# Patient Record
Sex: Female | Born: 1980 | Race: Asian | Hispanic: No | Marital: Single | Smoking: Never smoker
Health system: Southern US, Community
[De-identification: ages and names within clinical notes are randomized; demographics above are authoritative.]

## PROBLEM LIST (undated history)

## (undated) DIAGNOSIS — Z789 Other specified health status: Secondary | ICD-10-CM

## (undated) HISTORY — PX: NO PAST SURGERIES: SHX2092

## (undated) HISTORY — DX: Other specified health status: Z78.9

---

## 2009-12-24 ENCOUNTER — Emergency Department (HOSPITAL_COMMUNITY): Admission: EM | Admit: 2009-12-24 | Discharge: 2009-12-24 | Payer: Self-pay | Admitting: Family Medicine

## 2010-12-15 ENCOUNTER — Inpatient Hospital Stay (INDEPENDENT_AMBULATORY_CARE_PROVIDER_SITE_OTHER)
Admission: RE | Admit: 2010-12-15 | Discharge: 2010-12-15 | Disposition: A | Discharge status: Home / Self Care | Payer: BC Managed Care – PPO | Source: Ambulatory Visit | Attending: Emergency Medicine | Admitting: Emergency Medicine

## 2010-12-15 DIAGNOSIS — N390 Urinary tract infection, site not specified: Secondary | ICD-10-CM

## 2010-12-15 LAB — POCT URINALYSIS DIP (DEVICE)
Glucose, UA: NEGATIVE mg/dL
Ketones, ur: NEGATIVE mg/dL
Nitrite: NEGATIVE
Protein, ur: NEGATIVE mg/dL
Urobilinogen, UA: 0.2 mg/dL (ref 0.0–1.0)
pH: 7 (ref 5.0–8.0)

## 2010-12-15 LAB — POCT PREGNANCY, URINE: Preg Test, Ur: NEGATIVE

## 2010-12-24 LAB — POCT URINALYSIS DIP (DEVICE)
Bilirubin Urine: NEGATIVE
Nitrite: POSITIVE — AB
Protein, ur: 30 mg/dL — AB
pH: 7 (ref 5.0–8.0)

## 2010-12-24 LAB — URINE CULTURE: Colony Count: >=100000

## 2010-12-24 LAB — POCT RAPID STREP A (OFFICE): Streptococcus, Group A Screen (Direct): POSITIVE — AB

## 2010-12-24 LAB — POCT PREGNANCY, URINE: Preg Test, Ur: NEGATIVE

## 2015-01-03 ENCOUNTER — Encounter (HOSPITAL_COMMUNITY): Payer: Self-pay

## 2015-01-03 ENCOUNTER — Emergency Department (HOSPITAL_COMMUNITY)
Admission: EM | Admit: 2015-01-03 | Discharge: 2015-01-03 | Disposition: A | Discharge status: Home / Self Care | Payer: PRIVATE HEALTH INSURANCE | Attending: Emergency Medicine | Admitting: Emergency Medicine

## 2015-01-03 DIAGNOSIS — J069 Acute upper respiratory infection, unspecified: Secondary | ICD-10-CM | POA: Insufficient documentation

## 2015-01-03 DIAGNOSIS — R05 Cough: Secondary | ICD-10-CM | POA: Diagnosis present

## 2015-01-03 MED ORDER — PHENYLEPHRINE-DM-GG-APAP 5-10-200-325 MG PO TABS: 2.0000 | ORAL_TABLET | Freq: Three times a day (TID) | ORAL | Status: DC

## 2015-01-03 MED ORDER — ALBUTEROL SULFATE HFA 108 (90 BASE) MCG/ACT IN AERS
2.0000 | INHALATION_SPRAY | RESPIRATORY_TRACT | Status: AC
  Administered 2015-01-03: 2 via RESPIRATORY_TRACT
  Filled 2015-01-03: qty 6.7

## 2015-01-03 NOTE — ED Notes (Signed)
Pt called in main ED waiting area with no response 

## 2015-01-03 NOTE — Discharge Instructions (Signed)
Please follow directions provided. Use the referral or the resource guide provided to establish care with a primary care doctor to follow up. Use the inhaler 2 puffs every 4 hours to help with coughing. Please take the multisymptom cold medicine 3 times a day to help with your other symptoms. Don't hesitate to return for any new, worsening, or concerning symptoms.   SEEK IMMEDIATE MEDICAL CARE IF:  You have a fever.  You develop severe or persistent headache, ear pain, sinus pain, or chest pain.  You develop wheezing, a prolonged cough, cough up blood, or have a change in your usual mucus (if you have chronic lung disease).  You develop sore muscles or a stiff neck.   Emergency Department Resource Guide 1) Find a Doctor and Pay Out of Pocket Although you won't have to find out who is covered by your insurance plan, it is a good idea to ask around and get recommendations. You will then need to call the office and see if the doctor you have chosen will accept you as a new patient and what types of options they offer for patients who are self-pay. Some doctors offer discounts or will set up payment plans for their patients who do not have insurance, but you will need to ask so you aren't surprised when you get to your appointment.  2) Contact Your Local Health Department Not all health departments have doctors that can see patients for sick visits, but many do, so it is worth a call to see if yours does. If you don't know where your local health department is, you can check in your phone book. The CDC also has a tool to help you locate your state's health department, and many state websites also have listings of all of their local health departments.  3) Find a Walk-in Clinic If your illness is not likely to be very severe or complicated, you may want to try a walk in clinic. These are popping up all over the country in pharmacies, drugstores, and shopping centers. They'Notarianni usually staffed by nurse  practitioners or physician assistants that have been trained to treat common illnesses and complaints. They'[place] usually fairly quick and inexpensive. However, if you have serious medical issues or chronic medical problems, these are probably not your best option.  No Primary Care Doctor: - Call Health Connect at  [phone] - they can help you locate a primary care doctor that  accepts your insurance, provides certain services, etc. - Physician Referral Service- [phone]  Chronic Pain Problems: Organization         Address  Phone   Notes  [facility]  [phone] Patients need to be referred by their primary care doctor.   Medication Assistance: Organization         Address  Phone   Notes  [facility] Medication [facility] [address] [place], [place] [address] [phone] --Must be a resident of [facility] [place] -- Must have NO insurance coverage whatsoever (no Medicaid/ Medicare, etc.) -- The pt. MUST have a primary care doctor that directs their care regularly and follows them in the community   MedAssist  [phone]   [employer]  [phone]    Agencies that provide inexpensive medical care: Organization         Address  Phone   Notes  [facility]  [phone]   [facility]    [phone]  [facility] [address] [place], [place] [address] [phone]   [facility] [place] [address], [place] [phone]) [phone]   Planned Parenthood    [phone]   [facility]    [phone]   [facility]  [address], [place] Phone:  [phone]) [phone], Fax:  [phone]) [phone] Hours of Operation:  9 am - 6 pm, M-F.  Also accepts Medicaid/Medicare and self-pay.  [facility]  [address], [place] Phone: [phone], Fax: [phone]. Hours of Operation:  8:30 am -  5:30 pm, M-F.  Also accepts Medicaid and self-pay.  [facility] [place] [address], [place] Phone: [phone]) [phone]   [facility] [address], [place], [place] [phone], Ext. [phone] Mondays & Thursdays: 7-9 AM.  First 15 patients are seen on a first come, first serve basis.    Medicaid-accepting [facility] Providers:  Organization         Address  Phone   Notes  [facility] [address], [place] [phone] Also accepts self-pay patients.  [facility] [address] [place], [place]  [phone]   [facility] [address], [place] [phone]) [phone]   [facility] [address] [place] [address], [place] [phone]) [phone]   Renaye RakersVeita Bland [address], [place]   [phone] Only accepts [place] Access [place] patients after they have their name applied to their card.   Self-Pay (no insurance) in [facility]:  Organization         Address  Phone   Notes  Sickle Cell Patients, [facility] [address], [place] [phone]) [phone]   [facility] [address], [place] [phone]) [phone]   [facility] [place]  [address], [place] [phone]   [facility]/Dr. Osei-Bonsu  [address] [place] [address], [place] or [phone], [place] [phone]) [phone] Phone number for both [place] and [place] locations is the same.  [facility] [address], [place] [phone]   [facility] [place] [address] [place] [address], [place] or [address] [phone]   [facility] [address], [place] [phone], phone; [phone], fax Sees patients 1st and 3rd Saturday of every month.  Must not qualify for public or private insurance (i.e. Medicaid, Medicare, [place] Health Choice,  Veterans' Benefits)  Household income should be no more than 200% of the poverty level The clinic cannot treat you if you are pregnant or think you are pregnant  Sexually transmitted diseases are not treated at the clinic.    Dental Care: Organization         Address  Phone  Notes  [facility] of [facility] [address], [place] [phone] Accepts children up to age 32 who are enrolled in [place]icaid or [place] Health Choice; pregnant women with a Medicaid card; and children who have applied for Medicaid or [place] Health Choice, but were declined, whose parents can pay a reduced fee at time of service.  [facility] of [facility] [place]  [address], [place] [phone]) [phone] Accepts children up to age 32 who  are enrolled in Medicaid or [place] Health Choice; pregnant women with a Medicaid card; and children who have applied for Medicaid or [place] Health Choice, but were declined, whose parents can pay a reduced fee at time of service.  [facility]  [address], [place] [phone] Patients are seen by appointment only. Walk-ins are not accepted. [facility] will see patients 418 years of age and older. Monday - Tuesday (8am-5pm) Most Wednesdays (8:30-5pm) $30 per visit, cash only  [facility]  [address], [facility] [place] [phone] Patients are seen by appointment only. Walk-ins are not accepted. [facility] will see patients 32 years of age and older. One Wednesday Evening (Monthly: Volunteer Based).  $30 per visit, cash only  [facility]  [phone] for adults; Children under age 604, call Graduate Pediatric Dentistry at [phone]) [phone]. Children aged 534-14, please call [phone] to request a pediatric application.  Dental services are provided in all areas of dental care including fillings, crowns and bridges, complete and  partial dentures, implants, gum treatment, root canals, and extractions. Preventive care is also provided. Treatment is provided to both adults and children. Patients are selected via a lottery and there is often a waiting list.   [facility] [address], [place]  [phone] [email or web address]   [facility] [address], [place], [place] [phone], Ext. [phone] Second and Fourth Thursday of each month, opens at 6:30 AM; Clinic ends at 9 AM.  Patients are seen on a first-come first-served basis, and a limited number are seen during each clinic.   [facility]  [address], [place], [place] [phone]) [phone]   Eligibility Requirements You must have lived in [place], [place], or [place] counties for at least the last three months.   You cannot be eligible for state or federal sponsored National Cityhealthcare insurance, including CIGNAVeterans Administration, [place]icaid, or [employer] EntertainmentMedicare.   You generally cannot be eligible for healthcare insurance through your employer.    How to apply: Eligibility screenings are held every Tuesday and Wednesday afternoon from 1:00 pm until 4:00 pm. You do not need an appointment for the interview!  [facility] [address], [place], [place] [phone]   [facility]  [phone]   [facility]  [phone]   [facility]  [phone]    Behavioral Health Resources in the Community: Intensive Outpatient Programs Organization         Address  Phone  Notes  [facility] [address], [place], [place] [phone]   [facility] [address], [place], [place] [phone]   [facility] [address], [place], [place]  [phone]   [facility] [address],  [place], [place] [phone] or [phone]   Substance Abuse Resources Organization          Address  Phone  Notes  [facility] Drug Services  [phone]   [facility]  [phone]   [facility]  [phone]   [facility]  [phone]   Residential & Outpatient Substance Abuse Program  [phone]   Psychological Services Organization         Address  Phone  Notes  [facility]  [phone]   [facility]  [phone]- [phone]   [facility] [address],  [place] [phone] or [phone]    Mobile Crisis Teams Organization         Address  Phone  Notes  [facility], Mobile Crisis Care Unit  [phone]   [facility]  [address] [place], [place]   [place] Levels [address], [place] [phone]    Self-Help/Support Groups Organization         Address  Phone             Notes  [place]. of [place] - variety of support groups  [place] Call for more information  Narcotics Anonymous (NA), Caring [facility] [address], [employer] [place]  2 meetings at this location   Special educational needs teacher         Address  Phone  Notes  [facility]idential [facility] [place],    [employer]  [phone]   [facility]  [address], [place] [address]2683, [place], [place]   Ricardo [place], [place] [phone] Admissions: 8am-3pm M-F  Incentives Substance [place] [address],    [place], [place] [phone]   [facility] [address] Roy, [place], [place]   [facility] [address],  [place], [place] Choice   Insight Programs - Intensive Outpatient [address], [place], [place]   [facility] ([place].) [address].,  [place], [place] [phone] or [phone]   [facility] (RTS) [address] [place], [place] Accepts Medicaid  [facility] Rodri­guez Hevia [address]  [place] [phone] Substance Abuse/Addiction Treatment   [facility] Organization         Address  Phone  Notes  [facility]  [phone]   Domenic Schwab, PhD [address] Arlis Port[address] [place], [place]   [phone] or [phone]   [place] [address] [place], [place] [phone]   [facility] [address] [place] [address], [place], [place] [phone] Insurance/Medicaid/sponsorship through [facility] and Families [address]                                    [place], [place] [phone] [place] [address], [place] [phone]    Dr. Adele Schilder  [phone]   [facility]. 1) [address] [place] 2) [place] 3)  [place] [address], [place] [phone]  [phone]   [place] [phone] or [phone] (After Hours)

## 2015-01-03 NOTE — ED Provider Notes (Signed)
CSN: [id number]     Arrival date & time 01/03/15  1730 History  This chart was scribed for non-physician practitioner, Harle Battiest, NP-C working with Raeford Razor, MD, by Abel Presto, ED Scribe. This patient was seen in room TR07C/TR07C and the patient's care was started at 6:31 PM.     Chief Complaint  Patient presents with  . Cough  . Sore Throat    Patient is a 34 y.o. female presenting with cough and pharyngitis. The history is provided by the patient and a relative. No language interpreter was used.  Cough Associated symptoms: chills, fever (subjective), rhinorrhea and sore throat   Associated symptoms: no myalgias   Sore Throat   HPI Comments: Daryana Ka Ringwald is a 34 y.o. female who presents to the Emergency Department complaining of dry cough, sore throat, and rhinorrhea with onset 2 days ago. Pt reports associated subjective fever and chills.  Pt not febrile in exam room. Pt denies any significant PMHx. Pt took Tylenol for relief. Pt denies generalized body aches and trouble swallowing. Pt's son is here to translate.   History reviewed. No pertinent past medical history. History reviewed. No pertinent past surgical history. History reviewed. No pertinent family history. History  Substance Use Topics  . Smoking status: Never Smoker   . Smokeless tobacco: Not on file  . Alcohol Use: No   OB History    No data available     Review of Systems  Constitutional: Positive for fever (subjective) and chills.  HENT: Positive for postnasal drip, rhinorrhea and sore throat. Negative for trouble swallowing.   Respiratory: Positive for cough.   Musculoskeletal: Negative for myalgias.      Allergies  Review of patient's allergies indicates no known allergies.  Home Medications   Prior to Admission medications   Not on File   BP 105/70 mmHg  Pulse 72  Temp(Src) 98.2 F (36.8 C) (Oral)  Resp 16  Ht  (1.575 m)  Wt 145 lb (65.772 kg)  BMI 26.51 kg/m2  SpO2 100%   LMP 12/04/2014 Physical Exam  Constitutional: She is oriented to person, place, and time. She appears well-developed and well-nourished.  HENT:  Head: Normocephalic.  Nose: Right sinus exhibits no maxillary sinus tenderness. Left sinus exhibits no maxillary sinus tenderness.  Boggy nares bilaterally  Eyes: Conjunctivae are normal.  Neck: Normal range of motion. Neck supple.  Cardiovascular: Normal rate, regular rhythm and normal heart sounds.  Exam reveals no friction rub.   No murmur heard. Pulmonary/Chest: Effort normal and breath sounds normal. No respiratory distress. She has no wheezes. She has no rales.  Musculoskeletal: Normal range of motion.  Neurological: She is alert and oriented to person, place, and time.  Skin: Skin is warm and dry.  Psychiatric: She has a normal mood and affect. Her behavior is normal.  Nursing note and vitals reviewed.   ED Course  Procedures (including critical care time) DIAGNOSTIC STUDIES: Oxygen Saturation is 100% on room air, normal by my interpretation.    COORDINATION OF CARE: 6:36 PM Discussed treatment plan with patient at beside, the patient agrees with the plan and has no further questions at this time.   Labs Review Labs Reviewed - No data to display  Imaging Review No results found.   EKG Interpretation None      MDM   Final diagnoses:  URI (upper respiratory infection)   34 yo with symptoms consistent with URI.  Discussed that antibiotics are not indicated for viral infections.  MDI provided to help with cough.  Pt will be discharged with symptomatic treatment.  Verbalizes understanding and is agreeable with plan. Pt is hemodynamically stable & in NAD prior to dc.   I personally performed the services described in this documentation, which was scribed in my presence. The recorded information has been reviewed and is accurate.  Filed Vitals:   01/03/15 1809 01/03/15 1834 01/03/15 1843  BP: 105/70  97/60  Pulse: 72  81   Temp: 98.2 F (36.8 C)  98.1 F (36.7 C)  TempSrc: Oral  Oral  Resp: 16  22  Height: 5\' 2"  (1.575 m)    Weight: 145 lb (65.772 kg)    SpO2: 100% 100% 99%   Meds given in ED:  Medications  albuterol (PROVENTIL HFA;VENTOLIN HFA) 108 (90 BASE) MCG/ACT inhaler 2 puff (2 puffs Inhalation Given 01/03/15 1848)    Discharge Medication List as of 01/03/2015  6:42 PM    START taking these medications   Details  Phenylephrine-DM-GG-APAP (MUCINEX FAST-MAX COLD FLU) 5-10-200-325 MG TABS Take 2 tablets by mouth 3 (three) times daily., Starting 01/03/2015, Until Discontinued, Print           Harle BattiestElizabeth Holley Wirt, NP 01/05/15 1302  Raeford RazorStephen Kohut, MD 01/09/15 [phone]

## 2015-01-03 NOTE — ED Notes (Signed)
Pt stable, ambulatory, pain 7/10, states understanding of discharge instructions, teachback method with inhaler

## 2015-01-03 NOTE — ED Notes (Signed)
Onset yesterday runny nose, itchy nose, cough and sore throat.  No respiratory or swallowing difficulties

## 2016-09-30 NOTE — L&D Delivery Note (Signed)
Delivery Note At 2:52 AM a viable female was delivered via Vaginal, Spontaneous Delivery (Presentation: ;cephalic  ).  APGAR:pending weight  .  pending Placenta status: , .  Cord:  with the following complications: .  Cord pH: pending  Anesthesia:  none Episiotomy: None Lacerations: None  Est. Blood Loss (mL): 400  Mom to postpartum.  Baby to NICU.  Scheryl DarterJames Melecio Cueto 02/04/2017, 3:34 AM

## 2016-11-08 ENCOUNTER — Encounter: Payer: Self-pay | Admitting: Emergency Medicine

## 2016-11-08 ENCOUNTER — Ambulatory Visit (HOSPITAL_COMMUNITY)
Admission: EM | Admit: 2016-11-08 | Discharge: 2016-11-08 | Disposition: A | Discharge status: Home / Self Care | Payer: Medicaid Other | Attending: Family Medicine | Admitting: Family Medicine

## 2016-11-08 DIAGNOSIS — R059 Cough, unspecified: Secondary | ICD-10-CM

## 2016-11-08 DIAGNOSIS — R110 Nausea: Secondary | ICD-10-CM | POA: Diagnosis not present

## 2016-11-08 DIAGNOSIS — R05 Cough: Secondary | ICD-10-CM | POA: Diagnosis not present

## 2016-11-08 DIAGNOSIS — J40 Bronchitis, not specified as acute or chronic: Secondary | ICD-10-CM | POA: Diagnosis not present

## 2016-11-08 MED ORDER — AZITHROMYCIN 250 MG PO TABS: 250.0000 mg | ORAL_TABLET | Freq: Every day | ORAL | 0 refills | Status: DC

## 2016-11-08 MED ORDER — ONDANSETRON 4 MG PO TBDP: 4.0000 mg | ORAL_TABLET | Freq: Three times a day (TID) | ORAL | 0 refills | Status: DC | PRN

## 2016-11-08 MED ORDER — GUAIFENESIN-DM 100-10 MG/5ML PO SYRP: 10.0000 mL | ORAL_SOLUTION | ORAL | 0 refills | Status: DC | PRN

## 2016-11-08 NOTE — ED Provider Notes (Signed)
CSN: [id number]     Arrival date & time 11/08/16  1728 History   None    Chief Complaint  Patient presents with  . Morning Sickness  . Fever  . Cough   (Consider location/radiation/quality/duration/timing/severity/associated sxs/prior Treatment) Patient c/o uri sx's and cough and nausea.  She has developed nausea from coughing so much.  She has been having URI sx's and cough for 3 weeks.   The history is provided by the patient.  Fever  Temp source:  Subjective Severity:  Mild Onset quality:  Sudden Duration:  3 weeks Timing:  Constant Progression:  Unchanged Chronicity:  New Relieved by:  Nothing Worsened by:  Nothing Associated symptoms: congestion and cough   Cough  Associated symptoms: fever     History reviewed. No pertinent past medical history. History reviewed. No pertinent surgical history. History reviewed. No pertinent family history. Social History  Substance Use Topics  . Smoking status: Never Smoker  . Smokeless tobacco: Former Neurosurgeon    Types: Chew  . Alcohol use No   OB History    Gravida Para Term Preterm AB Living   1             SAB TAB Ectopic Multiple Live Births                 Review of Systems  Constitutional: Positive for fatigue and fever.  HENT: Positive for congestion.   Eyes: Negative.   Respiratory: Positive for cough.   Cardiovascular: Negative.   Gastrointestinal: Negative.   Endocrine: Negative.   Genitourinary: Negative.   Musculoskeletal: Negative.   Allergic/Immunologic: Negative.   Neurological: Negative.   Psychiatric/Behavioral: Negative.     Allergies  Patient has no known allergies.  Home Medications   Prior to Admission medications   Medication Sig Start Date End Date Taking? Authorizing Provider  doxylamine, Sleep, (UNISOM) 25 MG tablet Take 25 mg by mouth at bedtime as needed.   Yes Historical Provider, MD  pyridOXINE (VITAMIN B-6) 100 MG tablet Take 100 mg by mouth daily.   Yes Historical Provider, MD   azithromycin (ZITHROMAX) 250 MG tablet Take 1 tablet (250 mg total) by mouth daily. Take first 2 tablets together, then 1 every day until finished. 11/08/16   Deatra Canter, FNP  guaiFENesin-dextromethorphan (ROBITUSSIN DM) 100-10 MG/5ML syrup Take 10 mLs by mouth every 4 (four) hours as needed for cough. 11/08/16   Deatra Canter, FNP  ondansetron (ZOFRAN ODT) 4 MG disintegrating tablet Take 1 tablet (4 mg total) by mouth every 8 (eight) hours as needed for nausea or vomiting. 11/08/16   Deatra Canter, FNP  Phenylephrine-DM-GG-APAP (MUCINEX FAST-MAX COLD FLU) 5-10-200-325 MG TABS Take 2 tablets by mouth 3 (three) times daily. 01/03/15   Harle Battiest, NP   Meds Ordered and Administered this Visit  Medications - No data to display  BP 111/71 (BP Location: Right Arm)   Pulse 81   Temp 98.8 F (37.1 C) (Oral)   SpO2 100%  No data found.   Physical Exam  Constitutional: She appears well-developed and well-nourished.  HENT:  Head: Normocephalic and atraumatic.  Right Ear: External ear normal.  Left Ear: External ear normal.  Mouth/Throat: Oropharynx is clear and moist.  Eyes: Conjunctivae and EOM are normal. Pupils are equal, round, and reactive to light.  Neck: Normal range of motion. Neck supple.  Cardiovascular: Normal rate, regular rhythm and normal heart sounds.   Pulmonary/Chest: Effort normal and breath sounds normal.  Abdominal: Soft. Bowel  sounds are normal.  Nursing note and vitals reviewed.   Urgent Care Course     Procedures (including critical care time)  Labs Review Labs Reviewed - No data to display  Imaging Review No results found.   Visual Acuity Review  Right Eye Distance:   Left Eye Distance:   Bilateral Distance:    Right Eye Near:   Left Eye Near:    Bilateral Near:         MDM   1. Bronchitis   2. Cough   3. Nausea    Zpak Robitussin DM Zofran  Take tylenol otc prn for fever.      Deatra CanterWilliam J Korban Shearer, FNP 11/08/16 1823     Deatra CanterWilliam J Rowynn Mcweeney, FNP 11/08/16 [phone]

## 2016-11-08 NOTE — ED Triage Notes (Addendum)
Pt complains of fever, cough, and vomiting for three weeks.  Pt reports chills but has not measured her temperature at home. Pt is pregnant and due in August.  [facility]  Pt is here to be treated for the cough.

## 2016-11-28 ENCOUNTER — Other Ambulatory Visit: Payer: Self-pay

## 2016-11-28 LAB — OB RESULTS CONSOLE GC/CHLAMYDIA
Chlamydia: NEGATIVE
GC PROBE AMP, GENITAL: NEGATIVE

## 2016-11-28 LAB — OB RESULTS CONSOLE HGB/HCT, BLOOD
HEMATOCRIT: 36 %
Hemoglobin: 11.4 g/dL

## 2016-11-28 LAB — OB RESULTS CONSOLE HEPATITIS B SURFACE ANTIGEN: HEP B S AG: NEGATIVE

## 2016-11-28 LAB — OB RESULTS CONSOLE ABO/RH: RH Type: POSITIVE

## 2016-11-28 LAB — GLUCOSE TOLERANCE, 1 HOUR: GLUCOSE 1 HOUR GTT: 187

## 2016-11-28 LAB — OB RESULTS CONSOLE RPR: RPR: NONREACTIVE

## 2016-11-28 LAB — OB RESULTS CONSOLE VARICELLA ZOSTER ANTIBODY, IGG: VARICELLA IGG: NON-IMMUNE/NOT IMMUNE

## 2016-11-28 LAB — OB RESULTS CONSOLE ANTIBODY SCREEN: ANTIBODY SCREEN: NEGATIVE

## 2016-11-28 LAB — OB RESULTS CONSOLE HIV ANTIBODY (ROUTINE TESTING): HIV: NONREACTIVE

## 2016-11-29 LAB — GLUCOSE TOLERANCE, 3 HOURS
GLUCOSE FASTING GTT: 95 mg/dL (ref 80–110)
Glucose, GTT - 1 Hour: 260 mg/dL — AB (ref ?–200)
Glucose, GTT - 2 Hour: 160 mg/dL — AB (ref ?–140)
Glucose, GTT - 3 Hour: 116 mg/dL (ref ?–140)

## 2016-11-29 LAB — SICKLE CELL SCREEN: SICKLE CELL SCREEN: NORMAL

## 2016-12-02 ENCOUNTER — Ambulatory Visit: Payer: Medicaid Other | Admitting: *Deleted

## 2016-12-02 ENCOUNTER — Encounter: Payer: Medicaid Other | Attending: Obstetrics & Gynecology | Admitting: *Deleted

## 2016-12-02 DIAGNOSIS — O24410 Gestational diabetes mellitus in pregnancy, diet controlled: Secondary | ICD-10-CM

## 2016-12-02 DIAGNOSIS — Z3A00 Weeks of gestation of pregnancy not specified: Secondary | ICD-10-CM | POA: Insufficient documentation

## 2016-12-02 DIAGNOSIS — Z713 Dietary counseling and surveillance: Secondary | ICD-10-CM | POA: Insufficient documentation

## 2016-12-02 MED ORDER — ACCU-CHEK GUIDE W/DEVICE KIT
1.0000 | PACK | Freq: Once | 0 refills | Status: AC
Start: 2016-12-02 — End: 2016-12-02

## 2016-12-02 MED ORDER — GLUCOSE BLOOD VI STRP: ORAL_STRIP | 12 refills | Status: DC

## 2016-12-02 MED ORDER — ACCU-CHEK FASTCLIX LANCETS MISC: 1.0000 | Freq: Four times a day (QID) | 12 refills | Status: DC

## 2016-12-02 NOTE — Progress Notes (Signed)
  Patient was seen on 12/02/2016 for Gestational Diabetes self-management . She speaks [place], we used International aid/development worker for the visit. She states no family history of Diabetes that she is aware of. She also states there is someone in her home that reads in [place] so [place] handouts provided today.The following learning objectives were met by the patient :   States the definition of Gestational Diabetes  States why dietary management is important in controlling blood glucose  Describes the effects of carbohydrates on blood glucose levels  Demonstrates ability to create a balanced meal plan  Demonstrates carbohydrate counting   States when to check blood glucose levels  Demonstrates proper blood glucose monitoring techniques  States the effect of stress and exercise on blood glucose levels  States the importance of limiting caffeine and abstaining from alcohol and smoking  Plan:  Aim for 3 Carb Choices per meal (45 grams) +/- 1 either way  Aim for 1-2 Carbs per snack Begin reading food labels for Total Carbohydrate of foods Consider  increasing your activity level by walking or other activity daily as tolerated Begin checking BG before breakfast and 2 hours after first bite of breakfast, lunch and dinner as directed by MD  Take medication if directed by MD  Blood glucose monitor Rx called into pharmacy Patient instructed to test pre breakfast and 2 hours each meal as directed by MD Bring Log Book to every medical appointment   Patient instructed to monitor glucose levels: FBS: 60 - <90 2 hour: <120  Patient received the following handouts:  Nutrition Diabetes and Pregnancy  Carbohydrate Counting List  Patient will be seen for follow-up as needed.

## 2016-12-03 ENCOUNTER — Encounter: Payer: Self-pay | Admitting: *Deleted

## 2016-12-12 ENCOUNTER — Other Ambulatory Visit: Payer: Self-pay

## 2016-12-12 DIAGNOSIS — O280 Abnormal hematological finding on antenatal screening of mother: Secondary | ICD-10-CM

## 2016-12-16 ENCOUNTER — Ambulatory Visit (INDEPENDENT_AMBULATORY_CARE_PROVIDER_SITE_OTHER): Payer: Medicaid Other | Admitting: Obstetrics & Gynecology

## 2016-12-16 ENCOUNTER — Encounter: Payer: Self-pay | Admitting: Obstetrics & Gynecology

## 2016-12-16 VITALS — BP 106/71 | HR 92 | Wt 162.2 lb

## 2016-12-16 DIAGNOSIS — O24419 Gestational diabetes mellitus in pregnancy, unspecified control: Secondary | ICD-10-CM | POA: Insufficient documentation

## 2016-12-16 DIAGNOSIS — O24410 Gestational diabetes mellitus in pregnancy, diet controlled: Secondary | ICD-10-CM | POA: Diagnosis not present

## 2016-12-16 DIAGNOSIS — O0990 Supervision of high risk pregnancy, unspecified, unspecified trimester: Secondary | ICD-10-CM | POA: Insufficient documentation

## 2016-12-16 DIAGNOSIS — Z23 Encounter for immunization: Secondary | ICD-10-CM | POA: Diagnosis not present

## 2016-12-16 MED ORDER — GLYBURIDE 2.5 MG PO TABS: ORAL_TABLET | ORAL | 3 refills | Status: DC

## 2016-12-16 NOTE — Patient Instructions (Signed)
Gestational Diabetes Mellitus, Diagnosis Gestational diabetes (gestational diabetes mellitus) is a short-term (temporary) form of diabetes that can happen during pregnancy. It goes away after you give birth. It may be caused by one or both of these problems:  Your body does not make enough of a hormone called insulin.  Your body does not respond in a normal way to insulin that it makes. Insulin lets sugars (glucose) go into cells in the body. This gives you energy. If you have diabetes, sugars cannot get into cells. This causes high blood sugar (hyperglycemia). If diabetes is treated, it may not hurt you or your baby. Your doctor will set treatment goals for you. In general, you should have these blood sugar levels:  After not eating for a long time (fasting): 95 mg/dL (5.3 mmol/L).  After meals (postprandial):  One hour after a meal: at or below 140 mg/dL (7.8 mmol/L).  Two hours after a meal: at or below 120 mg/dL (6.7 mmol/L).  A1c (hemoglobin A1c) level: 6-6.5%. Follow these instructions at home: Questions to Ask Your Doctor  You may want to ask these questions:  Do I need to meet with a diabetes educator?  Where can I find a support group for people with diabetes?  What equipment will I need to care for myself at home?  What diabetes medicines do I need? When should I take them?  How often do I need to check my blood sugar?  What number can I call if I have questions?  When is my next doctor's visit? General instructions  Take over-the-counter and prescription medicines only as told by your doctor.  Stay at a healthy weight during pregnancy.  Keep all follow-up visits as told by your doctor. This is important. Contact a doctor if:  Your blood sugar is at or above 240 mg/dL (13.3 mmol/L).  Your blood sugar is at or above 200 mg/dL (11.1 mmol/L) and you have ketones in your pee (urine).  You have been sick or have had a fever for 2 days or more and you are not  getting better.  You have any of these problems for more than 6 hours:  You cannot eat or drink.  You feel sick to your stomach (nauseous).  You throw up (vomit).  You have watery poop (diarrhea). Get help right away if:  Your blood sugar is lower than 54 mg/dL (3 mmol/L).  You get confused.  You have trouble:  Thinking clearly.  Breathing.  Your baby moves less than normal.  You have:  Moderate or large ketone levels in your pee (urine).  Bleeding from your vagina.  Unusual fluid coming from your vagina.  Early contractions. These may feel like tightness in your belly. This information is not intended to replace advice given to you by your health care provider. Make sure you discuss any questions you have with your health care provider. Document Released: 01/08/2016 Document Revised: 02/22/2016 Document Reviewed: 10/20/2015 Elsevier Interactive Patient Education  2017 Elsevier Inc.  

## 2016-12-16 NOTE — Progress Notes (Signed)
   PRENATAL VISIT NOTE  Subjective:  Tiffany Crawford is a 36 y.o. G1P0000 at 21w5dbeing seen today for ongoing prenatal care.  She is currently monitored for the following issues for this high-risk pregnancy and has Supervision of high risk pregnancy, antepartum and Gestational diabetes mellitus, class A2 on her problem list.  Patient reports no complaints.  Contractions: Not present. Vag. Bleeding: None.  Movement: Absent. Denies leaking of fluid.   The following portions of the patient's history were reviewed and updated as appropriate: allergies, current medications, past family history, past medical history, past social history, past surgical history and problem list. Problem list updated.  Objective:   Vitals:   12/16/16 1005  BP: 106/71  Pulse: 92  Weight: 162 lb 3.2 oz (73.6 kg)    Fetal Status: Fetal Heart Rate (bpm): 156   Movement: Absent     General:  Alert, oriented and cooperative. Patient is in no acute distress.  Skin: Skin is warm and dry. No rash noted.   Cardiovascular: Normal heart rate noted  Respiratory: Normal respiratory effort, no problems with respiration noted  Abdomen: Soft, gravid, appropriate for gestational age. Pain/Pressure: Present     Pelvic:  Cervical exam deferred        Extremities: Normal range of motion.  Edema: None  Mental Status: Normal mood and affect. Normal behavior. Normal judgment and thought content.   Assessment and Plan:  Pregnancy: G1P0000 at 248w5d1. Gestational diabetes mellitus, class A2--worsening >50% of values in all categories are elevated.  Glyburide 2.5 mg bid started.  Teach back method with interpreter.   - Flu Vaccine QUAD 36+ mos IM (Fluarix, Quad PF) - [place] OB COMP + 14 WK; Future - TSH - Comp Met (CMET) - Protein / Creatinine Ratio, Urine - HgB A1c  2. Supervision of high risk pregnancy, antepartum - Flu Vaccine QUAD 36+ mos IM (Fluarix, Quad PF) - [place] OB COMP + 14 WK; Future - TSH - Comp Met (CMET) -  Protein / Creatinine Ratio, Urine - HgB A1c  Preterm labor symptoms and general obstetric precautions including but not limited to vaginal bleeding, contractions, leaking of fluid and fetal movement were reviewed in detail with the patient. Please refer to After Visit Summary for other counseling recommendations.  Return in about 1 week (around 12/23/2016).   KeGuss BundeMD

## 2016-12-17 ENCOUNTER — Ambulatory Visit (HOSPITAL_COMMUNITY)
Admission: RE | Admit: 2016-12-17 | Discharge: 2016-12-17 | Disposition: A | Discharge status: Home / Self Care | Payer: Medicaid Other | Source: Ambulatory Visit | Attending: Obstetrics and Gynecology | Admitting: Obstetrics and Gynecology

## 2016-12-17 DIAGNOSIS — O280 Abnormal hematological finding on antenatal screening of mother: Secondary | ICD-10-CM

## 2016-12-17 LAB — COMPREHENSIVE METABOLIC PANEL
ALT: 22 IU/L (ref 0–32)
AST: 23 IU/L (ref 0–40)
Albumin/Globulin Ratio: 1.1 — ABNORMAL LOW (ref 1.2–2.2)
Albumin: 3.8 g/dL (ref 3.5–5.5)
Alkaline Phosphatase: 56 IU/L (ref 39–117)
BUN/Creatinine Ratio: 12 (ref 9–23)
BUN: 5 mg/dL — AB (ref 6–20)
CALCIUM: 8.9 mg/dL (ref 8.7–10.2)
CHLORIDE: 99 mmol/L (ref 96–106)
CO2: 23 mmol/L (ref 18–29)
Creatinine, Ser: 0.43 mg/dL — ABNORMAL LOW (ref 0.57–1.00)
GFR calc non Af Amer: 133 mL/min/{1.73_m2} (ref >59)
GFR, EST AFRICAN AMERICAN: 153 mL/min/{1.73_m2} (ref >59)
Globulin, Total: 3.4 g/dL (ref 1.5–4.5)
Glucose: 128 mg/dL — ABNORMAL HIGH (ref 65–99)
Potassium: 3.7 mmol/L (ref 3.5–5.2)
Sodium: 136 mmol/L (ref 134–144)
Total Protein: 7.2 g/dL (ref 6.0–8.5)

## 2016-12-17 LAB — HEMOGLOBIN A1C
Est. average glucose Bld gHb Est-mCnc: 111 mg/dL
HEMOGLOBIN A1C: 5.5 % (ref 4.8–5.6)

## 2016-12-17 LAB — PROTEIN / CREATININE RATIO, URINE
CREATININE, UR: 162.3 mg/dL
PROTEIN/CREAT RATIO: 108 mg/g{creat} (ref 0–200)
Protein, Ur: 17.6 mg/dL

## 2016-12-17 LAB — TSH: TSH: 0.957 u[IU]/mL (ref 0.450–4.500)

## 2016-12-17 NOTE — Progress Notes (Signed)
Genetic Counseling  High-Risk Gestation Note  Appointment Date:  12/17/2016 Referred By: Mora Bellman, MD Date of Birth:  [birth year] Partner: Gaylyn Cheers Met Ra Pe   Pregnancy History: G1P0000 Estimated Date of Delivery: 04/30/17 Estimated Gestational Age: 74w6dAttending: MRenella Cunas MD   Ms. Nikola Ka Edds was seen for genetic counseling because of an increased risk for fetal Down syndrome based on Quad [facility] BTrentoninterpreter #[phone] interpretation for today's visit.   In summary:  Reviewed results of Quad screening test  Increased risk for Down syndrome (1 in 12)  Patient has not yet had ultrasound to confirm gestational dating; scheduled 12/18/16   Discussed additional screening options  NIPS  Plans to pursue Panorama tomorrow (12/18/16), if ultrasound at that time confirms gestational dating  If 12/18/16 ultrasound changes EDC and Quad screen is recalculated, patient would only desire Panorama if still increased risk for aneuploidy  Redraw Quad screen- this would only be an option if 12/18/16 changes EDC and the Quad screen was drawn too early  Ultrasound- scheduled 12/18/16 in our office  Discussed diagnostic testing options  Amniocentesis-declined  Reviewed family history concerns   She was counseled regarding the screening result and the associated 1 in 12 risk for fetal Down syndrome.  We reviewed chromosomes, nondisjunction, and the common features and variable prognosis of Down syndrome.  In addition, we reviewed the screen adjusted reduction in risks for trisomy 18 and open neural tube defects.  We also discussed other explanations for a screen positive result including: a gestational dating error, differences in maternal metabolism, and normal variation. Patient has not yet ultrasound in pregnancy to confirm gestational dating.   We reviewed other available screening  options including noninvasive prenatal screening (NIPS)/cell free DNA (cfDNA) screening, and detailed ultrasound.  She was counseled that screening tests are used to modify a patient's a priori risk for aneuploidy, typically based on age. This estimate provides a pregnancy specific risk assessment. We reviewed the benefits and limitations of each option. Specifically, we discussed the conditions for which each test screens, the detection rates, and false positive rates of each. She was also counseled regarding diagnostic testing via amniocentesis. We reviewed the approximate 1 in 3751-025risk for complications from amniocentesis, including spontaneous pregnancy loss. We discussed the possible results that the tests might provide including: positive, negative, unanticipated, and no result. Finally, they were counseled regarding the cost of each option and potential out of pocket expenses.   The patient's detailed ultrasound was originally scheduled for 12/18/16 (prior to receiving results of Quad screen). She plans to return tomorrow for 12/18/16 detailed ultrasound. If ultrasound confirms her gestational dating, or if dating changes and the recalculated Quad screen is still increased risk for Down syndrome, she would like to pursue NIPS (Panorama) that day. If ultrasound changes the EDC, and the Quad screen was drawn too early, the patient has the option of having her Quad screen redrawn using the corrected EDC.  The patient declined amniocentesis in pregnancy regardless of the specific risk for aneuploidy, given the associated risk for complications.  She understands that screening tests cannot rule out all birth defects or genetic syndromes.   Both family histories were reviewed and found to be contributory for kidney failure for one of the patient's sisters and the patient's mother. She reported that both her sister, currently age 2372years old, and her mother are on dialysis for treatment. The patient had  limited  information regarding the underlying etiology/etiologies for kidney failure for these relatives. Without further information regarding the provided family history, an accurate genetic risk cannot be calculated. Further genetic counseling is warranted if more information is obtained.  Ms. Vines denied exposure to environmental toxins or chemical agents. She denied the use of alcohol, tobacco or street drugs. She denied significant viral illnesses during the course of her pregnancy. Her medical and surgical histories were noncontributory.   I counseled Ms. Karlie Ka Kok this couple for approximately 50 minutes regarding the above risks and available options.   Chipper Oman, MS,  Certified Genetic Counselor 12/17/2016

## 2016-12-18 ENCOUNTER — Ambulatory Visit (HOSPITAL_COMMUNITY): Admission: RE | Admit: 2016-12-18 | Payer: Medicaid Other | Source: Ambulatory Visit

## 2016-12-18 ENCOUNTER — Encounter (HOSPITAL_COMMUNITY): Payer: Self-pay

## 2016-12-18 ENCOUNTER — Other Ambulatory Visit: Payer: Self-pay | Admitting: Obstetrics & Gynecology

## 2016-12-18 ENCOUNTER — Ambulatory Visit (HOSPITAL_COMMUNITY)
Admission: RE | Admit: 2016-12-18 | Discharge: 2016-12-18 | Disposition: A | Discharge status: Home / Self Care | Payer: Medicaid Other | Source: Ambulatory Visit | Attending: Obstetrics & Gynecology | Admitting: Obstetrics & Gynecology

## 2016-12-18 DIAGNOSIS — Z3A21 21 weeks gestation of pregnancy: Secondary | ICD-10-CM

## 2016-12-18 DIAGNOSIS — O24415 Gestational diabetes mellitus in pregnancy, controlled by oral hypoglycemic drugs: Secondary | ICD-10-CM | POA: Insufficient documentation

## 2016-12-18 DIAGNOSIS — Z3687 Encounter for antenatal screening for uncertain dates: Secondary | ICD-10-CM | POA: Insufficient documentation

## 2016-12-18 DIAGNOSIS — O0990 Supervision of high risk pregnancy, unspecified, unspecified trimester: Secondary | ICD-10-CM

## 2016-12-18 DIAGNOSIS — Z3A16 16 weeks gestation of pregnancy: Secondary | ICD-10-CM | POA: Diagnosis not present

## 2016-12-18 DIAGNOSIS — O24410 Gestational diabetes mellitus in pregnancy, diet controlled: Secondary | ICD-10-CM

## 2016-12-18 DIAGNOSIS — O283 Abnormal ultrasonic finding on antenatal screening of mother: Secondary | ICD-10-CM | POA: Diagnosis not present

## 2016-12-18 DIAGNOSIS — Z363 Encounter for antenatal screening for malformations: Secondary | ICD-10-CM | POA: Diagnosis present

## 2016-12-19 ENCOUNTER — Other Ambulatory Visit (HOSPITAL_COMMUNITY): Payer: Self-pay | Admitting: *Deleted

## 2016-12-19 DIAGNOSIS — IMO0002 Reserved for concepts with insufficient information to code with codable children: Secondary | ICD-10-CM

## 2016-12-19 DIAGNOSIS — Z0489 Encounter for examination and observation for other specified reasons: Secondary | ICD-10-CM

## 2016-12-23 ENCOUNTER — Ambulatory Visit (INDEPENDENT_AMBULATORY_CARE_PROVIDER_SITE_OTHER): Payer: Medicaid Other | Admitting: Family Medicine

## 2016-12-23 VITALS — BP 98/62 | HR 80 | Wt 165.9 lb

## 2016-12-23 DIAGNOSIS — O24419 Gestational diabetes mellitus in pregnancy, unspecified control: Secondary | ICD-10-CM

## 2016-12-23 DIAGNOSIS — O0990 Supervision of high risk pregnancy, unspecified, unspecified trimester: Secondary | ICD-10-CM

## 2016-12-23 MED ORDER — PREPLUS 27-1 MG PO TABS: 1.0000 | ORAL_TABLET | Freq: Every day | ORAL | 11 refills | Status: AC

## 2016-12-23 MED ORDER — GLYBURIDE 2.5 MG PO TABS: ORAL_TABLET | ORAL | 3 refills | Status: DC

## 2016-12-23 NOTE — Patient Instructions (Signed)
Mucinex Robitussin DM (plain only, alcohol free)

## 2016-12-23 NOTE — Progress Notes (Signed)
Subjective:  Tiffany Crawford is a 36 y.o. G1P0000 at 3885w4d being seen today for ongoing prenatal care.  She is currently monitored for the following issues for this high-risk pregnancy and has Supervision of high risk pregnancy, antepartum; Gestational diabetes mellitus, class A2; and Abnormal maternal serum screening test on her problem list.  GDM: Patient taking glyburide 2.5mg  BID.  Reports no hypoglycemic episodes.  Tolerating medication well Fasting: 90-120, mostly in 90s 2hr PP: 102-130 (one in 180, 4 elevated)  Patient reports no complaints.  Contractions: Not present.  .  Movement: Present. Denies leaking of fluid.   The following portions of the patient's history were reviewed and updated as appropriate: allergies, current medications, past family history, past medical history, past social history, past surgical history and problem list. Problem list updated.  Objective:   Vitals:   12/23/16 1336  BP: 98/62  Pulse: 80  Weight: 165 lb 14.4 oz (75.3 kg)    Fetal Status:     Movement: Present     General:  Alert, oriented and cooperative. Patient is in no acute distress.  Skin: Skin is warm and dry. No rash noted.   Cardiovascular: Normal heart rate noted  Respiratory: Normal respiratory effort, no problems with respiration noted  Abdomen: Soft, gravid, appropriate for gestational age. Pain/Pressure: Present     Pelvic:       Cervical exam deferred        Extremities: Normal range of motion.  Edema: None  Mental Status: Normal mood and affect. Normal behavior. Normal judgment and thought content.   Urinalysis:      Assessment and Plan:  Pregnancy: G1P0000 at 7385w4d  1. Supervision of high risk pregnancy, antepartum FHT normal. OTC cold medications given to patient.  2. Gestational diabetes mellitus, class A2 Increase glyburide to 5mg  at bedtime and 2.5mg  in AM.   Preterm labor symptoms and general obstetric precautions including but not limited to vaginal bleeding,  contractions, leaking of fluid and fetal movement were reviewed in detail with the patient. Please refer to After Visit Summary for other counseling recommendations.  No Follow-up on file.   Levie HeritageJacob J Satcha Storlie, DO

## 2017-01-06 ENCOUNTER — Ambulatory Visit (INDEPENDENT_AMBULATORY_CARE_PROVIDER_SITE_OTHER): Payer: Medicaid Other | Admitting: Family Medicine

## 2017-01-06 DIAGNOSIS — O24419 Gestational diabetes mellitus in pregnancy, unspecified control: Secondary | ICD-10-CM

## 2017-01-06 DIAGNOSIS — O0992 Supervision of high risk pregnancy, unspecified, second trimester: Secondary | ICD-10-CM

## 2017-01-06 DIAGNOSIS — O0990 Supervision of high risk pregnancy, unspecified, unspecified trimester: Secondary | ICD-10-CM

## 2017-01-06 NOTE — Patient Instructions (Signed)
 Third Trimester of Pregnancy The third trimester is from week 28 through week 40 (months 7 through 9). The third trimester is a time when the unborn baby (fetus) is growing rapidly. At the end of the ninth month, the fetus is about 20 inches in length and weighs 6-10 pounds. Body changes during your third trimester Your body will continue to go through many changes during pregnancy. The changes vary from woman to woman. During the third trimester:  Your weight will continue to increase. You can expect to gain 25-35 pounds (11-16 kg) by the end of the pregnancy.  You may begin to get stretch marks on your hips, abdomen, and breasts.  You may urinate more often because the fetus is moving lower into your pelvis and pressing on your bladder.  You may develop or continue to have heartburn. This is caused by increased hormones that slow down muscles in the digestive tract.  You may develop or continue to have constipation because increased hormones slow digestion and cause the muscles that push waste through your intestines to relax.  You may develop hemorrhoids. These are swollen veins (varicose veins) in the rectum that can itch or be painful.  You may develop swollen, bulging veins (varicose veins) in your legs.  You may have increased body aches in the pelvis, back, or thighs. This is due to weight gain and increased hormones that are relaxing your joints.  You may have changes in your hair. These can include thickening of your hair, rapid growth, and changes in texture. Some women also have hair loss during or after pregnancy, or hair that feels dry or thin. Your hair will most likely return to normal after your baby is born.  Your breasts will continue to grow and they will continue to become tender. A yellow fluid (colostrum) may leak from your breasts. This is the first milk you are producing for your baby.  Your belly button may stick out.  You may notice more swelling in your  hands, face, or ankles.  You may have increased tingling or numbness in your hands, arms, and legs. The skin on your belly may also feel numb.  You may feel short of breath because of your expanding uterus.  You may have more problems sleeping. This can be caused by the size of your belly, increased need to urinate, and an increase in your body's metabolism.  You may notice the fetus "dropping," or moving lower in your abdomen (lightening).  You may have increased vaginal discharge.  You may notice your joints feel loose and you may have pain around your pelvic bone.  What to expect at prenatal visits You will have prenatal exams every 2 weeks until week 36. Then you will have weekly prenatal exams. During a routine prenatal visit:  You will be weighed to make sure you and the baby are growing normally.  Your blood pressure will be taken.  Your abdomen will be measured to track your baby's growth.  The fetal heartbeat will be listened to.  Any test results from the previous visit will be discussed.  You may have a cervical check near your due date to see if your cervix has softened or thinned (effaced).  You will be tested for Group B streptococcus. This happens between 35 and 37 weeks.  Your health care provider may ask you:  What your birth plan is.  How you are feeling.  If you are feeling the baby move.  If you have   had any abnormal symptoms, such as leaking fluid, bleeding, severe headaches, or abdominal cramping.  If you are using any tobacco products, including cigarettes, chewing tobacco, and electronic cigarettes.  If you have any questions.  Other tests or screenings that may be performed during your third trimester include:  Blood tests that check for low iron levels (anemia).  Fetal testing to check the health, activity level, and growth of the fetus. Testing is done if you have certain medical conditions or if there are problems during the  pregnancy.  Nonstress test (NST). This test checks the health of your baby to make sure there are no signs of problems, such as the baby not getting enough oxygen. During this test, a belt is placed around your belly. The baby is made to move, and its heart rate is monitored during movement.  What is false labor? False labor is a condition in which you feel small, irregular tightenings of the muscles in the womb (contractions) that usually go away with rest, changing position, or drinking water. These are called Braxton Hicks contractions. Contractions may last for hours, days, or even weeks before true labor sets in. If contractions come at regular intervals, become more frequent, increase in intensity, or become painful, you should see your health care provider. What are the signs of labor?  Abdominal cramps.  Regular contractions that start at 10 minutes apart and become stronger and more frequent with time.  Contractions that start on the top of the uterus and spread down to the lower abdomen and back.  Increased pelvic pressure and dull back pain.  A watery or bloody mucus discharge that comes from the vagina.  Leaking of amniotic fluid. This is also known as your "water breaking." It could be a slow trickle or a gush. Let your health care provider know if it has a color or strange odor. If you have any of these signs, call your health care provider right away, even if it is before your due date. Follow these instructions at home: Medicines  Follow your health care provider's instructions regarding medicine use. Specific medicines may be either safe or unsafe to take during pregnancy.  Take a prenatal vitamin that contains at least 600 micrograms (mcg) of folic acid.  If you develop constipation, try taking a stool softener if your health care provider approves. Eating and drinking  Eat a balanced diet that includes fresh fruits and vegetables, whole grains, good sources of protein  such as meat, eggs, or tofu, and low-fat dairy. Your health care provider will help you determine the amount of weight gain that is right for you.  Avoid raw meat and uncooked cheese. These carry germs that can cause birth defects in the baby.  If you have low calcium intake from food, talk to your health care provider about whether you should take a daily calcium supplement.  Eat four or five small meals rather than three large meals a day.  Limit foods that are high in fat and processed sugars, such as fried and sweet foods.  To prevent constipation: ? Drink enough fluid to keep your urine clear or pale yellow. ? Eat foods that are high in fiber, such as fresh fruits and vegetables, whole grains, and beans. Activity  Exercise only as directed by your health care provider. Most women can continue their usual exercise routine during pregnancy. Try to exercise for 30 minutes at least 5 days a week. Stop exercising if you experience uterine contractions.  Avoid   heavy lifting.  Do not exercise in extreme heat or humidity, or at high altitudes.  Wear low-heel, comfortable shoes.  Practice good posture.  You may continue to have sex unless your health care provider tells you otherwise. Relieving pain and discomfort  Take frequent breaks and rest with your legs elevated if you have leg cramps or low back pain.  Take warm sitz baths to soothe any pain or discomfort caused by hemorrhoids. Use hemorrhoid cream if your health care provider approves.  Wear a good support bra to prevent discomfort from breast tenderness.  If you develop varicose veins: ? Wear support pantyhose or compression stockings as told by your healthcare provider. ? Elevate your feet for 15 minutes, 3-4 times a day. Prenatal care  Write down your questions. Take them to your prenatal visits.  Keep all your prenatal visits as told by your health care provider. This is important. Safety  Wear your seat belt at  all times when driving.  Make a list of emergency phone numbers, including numbers for family, friends, the hospital, and police and fire departments. General instructions  Avoid cat litter boxes and soil used by cats. These carry germs that can cause birth defects in the baby. If you have a cat, ask someone to clean the litter box for you.  Do not travel far distances unless it is absolutely necessary and only with the approval of your health care provider.  Do not use hot tubs, steam rooms, or saunas.  Do not drink alcohol.  Do not use any products that contain nicotine or tobacco, such as cigarettes and e-cigarettes. If you need help quitting, ask your health care provider.  Do not use any medicinal herbs or unprescribed drugs. These chemicals affect the formation and growth of the baby.  Do not douche or use tampons or scented sanitary pads.  Do not cross your legs for long periods of time.  To prepare for the arrival of your baby: ? Take prenatal classes to understand, practice, and ask questions about labor and delivery. ? Make a trial run to the hospital. ? Visit the hospital and tour the maternity area. ? Arrange for maternity or paternity leave through employers. ? Arrange for family and friends to take care of pets while you are in the hospital. ? Purchase a rear-facing car seat and make sure you know how to install it in your car. ? Pack your hospital bag. ? Prepare the baby's nursery. Make sure to remove all pillows and stuffed animals from the baby's crib to prevent suffocation.  Visit your dentist if you have not gone during your pregnancy. Use a soft toothbrush to brush your teeth and be gentle when you floss. Contact a health care provider if:  You are unsure if you are in labor or if your water has broken.  You become dizzy.  You have mild pelvic cramps, pelvic pressure, or nagging pain in your abdominal area.  You have lower back pain.  You have persistent  nausea, vomiting, or diarrhea.  You have an unusual or bad smelling vaginal discharge.  You have pain when you urinate. Get help right away if:  Your water breaks before 37 weeks.  You have regular contractions less than 5 minutes apart before 37 weeks.  You have a fever.  You are leaking fluid from your vagina.  You have spotting or bleeding from your vagina.  You have severe abdominal pain or cramping.  You have rapid weight loss or weight   gain.  You have shortness of breath with chest pain.  You notice sudden or extreme swelling of your face, hands, ankles, feet, or legs.  Your baby makes fewer than 10 movements in 2 hours.  You have severe headaches that do not go away when you take medicine.  You have vision changes. Summary  The third trimester is from week 28 through week 40, months 7 through 9. The third trimester is a time when the unborn baby (fetus) is growing rapidly.  During the third trimester, your discomfort may increase as you and your baby continue to gain weight. You may have abdominal, leg, and back pain, sleeping problems, and an increased need to urinate.  During the third trimester your breasts will keep growing and they will continue to become tender. A yellow fluid (colostrum) may leak from your breasts. This is the first milk you are producing for your baby.  False labor is a condition in which you feel small, irregular tightenings of the muscles in the womb (contractions) that eventually go away. These are called Braxton Hicks contractions. Contractions may last for hours, days, or even weeks before true labor sets in.  Signs of labor can include: abdominal cramps; regular contractions that start at 10 minutes apart and become stronger and more frequent with time; watery or bloody mucus discharge that comes from the vagina; increased pelvic pressure and dull back pain; and leaking of amniotic fluid. This information is not intended to replace advice  given to you by your health care provider. Make sure you discuss any questions you have with your health care provider. Document Released: 09/10/2001 Document Revised: 02/22/2016 Document Reviewed: 11/17/2012 [employer] Interactive Patient Education  2017 [employer]   Breastfeeding Deciding to breastfeed is one of the best choices you can make for you and your baby. A change in hormones during pregnancy causes your breast tissue to grow and increases the number and size of your milk ducts. These hormones also allow proteins, sugars, and fats from your blood supply to make breast milk in your milk-producing glands. Hormones prevent breast milk from being released before your baby is born as well as prompt milk flow after birth. Once breastfeeding has begun, thoughts of your baby, as well as his or her sucking or crying, can stimulate the release of milk from your milk-producing glands. Benefits of breastfeeding For Your Baby  Your first milk (colostrum) helps your baby's digestive system function better.  There are antibodies in your milk that help your baby fight off infections.  Your baby has a lower incidence of asthma, allergies, and sudden infant death syndrome.  The nutrients in breast milk are better for your baby than infant formulas and are designed uniquely for your baby's needs.  Breast milk improves your baby's brain development.  Your baby is less likely to develop other conditions, such as childhood obesity, asthma, or type 2 diabetes mellitus.  For You  Breastfeeding helps to create a very special bond between you and your baby.  Breastfeeding is convenient. Breast milk is always available at the correct temperature and costs nothing.  Breastfeeding helps to burn calories and helps you lose the weight gained during pregnancy.  Breastfeeding makes your uterus contract to its prepregnancy size faster and slows bleeding (lochia) after you give birth.  Breastfeeding helps  to lower your risk of developing type 2 diabetes mellitus, osteoporosis, and breast or ovarian cancer later in life.  Signs that your baby is hungry Early Signs of Hunger    Increased alertness or activity.  Stretching.  Movement of the head from side to side.  Movement of the head and opening of the mouth when the corner of the mouth or cheek is stroked (rooting).  Increased sucking sounds, smacking lips, cooing, sighing, or squeaking.  Hand-to-mouth movements.  Increased sucking of fingers or hands.  Late Signs of Hunger  Fussing.  Intermittent crying.  Extreme Signs of Hunger Signs of extreme hunger will require calming and consoling before your baby will be able to breastfeed successfully. Do not wait for the following signs of extreme hunger to occur before you initiate breastfeeding:  Restlessness.  A loud, strong cry.  Screaming.  Breastfeeding basics Breastfeeding Initiation  Find a comfortable place to sit or lie down, with your neck and back well supported.  Place a pillow or rolled up blanket under your baby to bring him or her to the level of your breast (if you are seated). Nursing pillows are specially designed to help support your arms and your baby while you breastfeed.  Make sure that your baby's abdomen is facing your abdomen.  Gently massage your breast. With your fingertips, massage from your chest wall toward your nipple in a circular motion. This encourages milk flow. You may need to continue this action during the feeding if your milk flows slowly.  Support your breast with 4 fingers underneath and your thumb above your nipple. Make sure your fingers are well away from your nipple and your baby's mouth.  Stroke your baby's lips gently with your finger or nipple.  When your baby's mouth is open wide enough, quickly bring your baby to your breast, placing your entire nipple and as much of the colored area around your nipple (areola) as possible into  your baby's mouth. ? More areola should be visible above your baby's upper lip than below the lower lip. ? Your baby's tongue should be between his or her lower gum and your breast.  Ensure that your baby's mouth is correctly positioned around your nipple (latched). Your baby's lips should create a seal on your breast and be turned out (everted).  It is common for your baby to suck about 2-3 minutes in order to start the flow of breast milk.  Latching Teaching your baby how to latch on to your breast properly is very important. An improper latch can cause nipple pain and decreased milk supply for you and poor weight gain in your baby. Also, if your baby is not latched onto your nipple properly, he or she may swallow some air during feeding. This can make your baby fussy. Burping your baby when you switch breasts during the feeding can help to get rid of the air. However, teaching your baby to latch on properly is still the best way to prevent fussiness from swallowing air while breastfeeding. Signs that your baby has successfully latched on to your nipple:  Silent tugging or silent sucking, without causing you pain.  Swallowing heard between every 3-4 sucks.  Muscle movement above and in front of his or her ears while sucking.  Signs that your baby has not successfully latched on to nipple:  Sucking sounds or smacking sounds from your baby while breastfeeding.  Nipple pain.  If you think your baby has not latched on correctly, slip your finger into the corner of your baby's mouth to break the suction and place it between your baby's gums. Attempt breastfeeding initiation again. Signs of Successful Breastfeeding Signs from your baby:  A   gradual decrease in the number of sucks or complete cessation of sucking.  Falling asleep.  Relaxation of his or her body.  Retention of a small amount of milk in his or her mouth.  Letting go of your breast by himself or herself.  Signs from  you:  Breasts that have increased in firmness, weight, and size 1-3 hours after feeding.  Breasts that are softer immediately after breastfeeding.  Increased milk volume, as well as a change in milk consistency and color by the fifth day of breastfeeding.  Nipples that are not sore, cracked, or bleeding.  Signs That Your Baby is Getting Enough Milk  Wetting at least 1-2 diapers during the first 24 hours after birth.  Wetting at least 5-6 diapers every 24 hours for the first week after birth. The urine should be clear or pale yellow by 5 days after birth.  Wetting 6-8 diapers every 24 hours as your baby continues to grow and develop.  At least 3 stools in a 24-hour period by age 5 days. The stool should be soft and yellow.  At least 3 stools in a 24-hour period by age 7 days. The stool should be seedy and yellow.  No loss of weight greater than 10% of birth weight during the first 3 days of age.  Average weight gain of 4-7 ounces (113-198 g) per week after age 4 days.  Consistent daily weight gain by age 5 days, without weight loss after the age of 2 weeks.  After a feeding, your baby may spit up a small amount. This is common. Breastfeeding frequency and duration Frequent feeding will help you make more milk and can prevent sore nipples and breast engorgement. Breastfeed when you feel the need to reduce the fullness of your breasts or when your baby shows signs of hunger. This is called "breastfeeding on demand." Avoid introducing a pacifier to your baby while you are working to establish breastfeeding (the first 4-6 weeks after your baby is born). After this time you may choose to use a pacifier. Research has shown that pacifier use during the first year of a baby's life decreases the risk of sudden infant death syndrome (SIDS). Allow your baby to feed on each breast as long as he or she wants. Breastfeed until your baby is finished feeding. When your baby unlatches or falls asleep  while feeding from the first breast, offer the second breast. Because newborns are often sleepy in the first few weeks of life, you may need to awaken your baby to get him or her to feed. Breastfeeding times will vary from baby to baby. However, the following rules can serve as a guide to help you ensure that your baby is properly fed:  Newborns (babies 4 weeks of age or younger) may breastfeed every 1-3 hours.  Newborns should not go longer than 3 hours during the day or 5 hours during the night without breastfeeding.  You should breastfeed your baby a minimum of 8 times in a 24-hour period until you begin to introduce solid foods to your baby at around 6 months of age.  Breast milk pumping Pumping and storing breast milk allows you to ensure that your baby is exclusively fed your breast milk, even at times when you are unable to breastfeed. This is especially important if you are going back to work while you are still breastfeeding or when you are not able to be present during feedings. Your lactation consultant can give you guidelines on how   long it is safe to store breast milk. A breast pump is a machine that allows you to pump milk from your breast into a sterile bottle. The pumped breast milk can then be stored in a refrigerator or freezer. Some breast pumps are operated by hand, while others use electricity. Ask your lactation consultant which type will work best for you. Breast pumps can be purchased, but some hospitals and breastfeeding support groups lease breast pumps on a monthly basis. A lactation consultant can teach you how to hand express breast milk, if you prefer not to use a pump. Caring for your breasts while you breastfeed Nipples can become dry, cracked, and sore while breastfeeding. The following recommendations can help keep your breasts moisturized and healthy:  Avoid using soap on your nipples.  Wear a supportive bra. Although not required, special nursing bras and tank  tops are designed to allow access to your breasts for breastfeeding without taking off your entire bra or top. Avoid wearing underwire-style bras or extremely tight bras.  Air dry your nipples for 3-4minutes after each feeding.  Use only cotton bra pads to absorb leaked breast milk. Leaking of breast milk between feedings is normal.  Use lanolin on your nipples after breastfeeding. Lanolin helps to maintain your skin's normal moisture barrier. If you use pure lanolin, you do not need to wash it off before feeding your baby again. Pure lanolin is not toxic to your baby. You may also hand express a few drops of breast milk and gently massage that milk into your nipples and allow the milk to air dry.  In the first few weeks after giving birth, some women experience extremely full breasts (engorgement). Engorgement can make your breasts feel heavy, warm, and tender to the touch. Engorgement peaks within 3-5 days after you give birth. The following recommendations can help ease engorgement:  Completely empty your breasts while breastfeeding or pumping. You may want to start by applying warm, moist heat (in the shower or with warm water-soaked hand towels) just before feeding or pumping. This increases circulation and helps the milk flow. If your baby does not completely empty your breasts while breastfeeding, pump any extra milk after he or she is finished.  Wear a snug bra (nursing or regular) or tank top for 1-2 days to signal your body to slightly decrease milk production.  Apply ice packs to your breasts, unless this is too uncomfortable for you.  Make sure that your baby is latched on and positioned properly while breastfeeding.  If engorgement persists after 48 hours of following these recommendations, contact your health care provider or a lactation consultant. Overall health care recommendations while breastfeeding  Eat healthy foods. Alternate between meals and snacks, eating 3 of each per  day. Because what you eat affects your breast milk, some of the foods may make your baby more irritable than usual. Avoid eating these foods if you are sure that they are negatively affecting your baby.  Drink milk, fruit juice, and water to satisfy your thirst (about 10 glasses a day).  Rest often, relax, and continue to take your prenatal vitamins to prevent fatigue, stress, and anemia.  Continue breast self-awareness checks.  Avoid chewing and smoking tobacco. Chemicals from cigarettes that pass into breast milk and exposure to secondhand smoke may harm your baby.  Avoid alcohol and drug use, including marijuana. Some medicines that may be harmful to your baby can pass through breast milk. It is important to ask your health care   provider before taking any medicine, including all over-the-counter and prescription medicine as well as vitamin and herbal supplements. It is possible to become pregnant while breastfeeding. If birth control is desired, ask your health care provider about options that will be safe for your baby. Contact a health care provider if:  You feel like you want to stop breastfeeding or have become frustrated with breastfeeding.  You have painful breasts or nipples.  Your nipples are cracked or bleeding.  Your breasts are red, tender, or warm.  You have a swollen area on either breast.  You have a fever or chills.  You have nausea or vomiting.  You have drainage other than breast milk from your nipples.  Your breasts do not become full before feedings by the fifth day after you give birth.  You feel sad and depressed.  Your baby is too sleepy to eat well.  Your baby is having trouble sleeping.  Your baby is wetting less than 3 diapers in a 24-hour period.  Your baby has less than 3 stools in a 24-hour period.  Your baby's skin or the white part of his or her eyes becomes yellow.  Your baby is not gaining weight by 5 days of age. Get help right away  if:  Your baby is overly tired (lethargic) and does not want to wake up and feed.  Your baby develops an unexplained fever. This information is not intended to replace advice given to you by your health care provider. Make sure you discuss any questions you have with your health care provider. Document Released: 09/16/2005 Document Revised: 02/28/2016 Document Reviewed: 03/10/2013 Elsevier Interactive Patient Education  2017 Elsevier Inc.  

## 2017-01-06 NOTE — Progress Notes (Signed)
Patient has not checked her blood sugar in 4 days she states she is out of test strip and doesnt know where to get them from.

## 2017-01-06 NOTE — Progress Notes (Signed)
    PRENATAL VISIT NOTE  Subjective:  Tiffany Crawford is a 36 y.o. G1P0000 at [redacted]w[redacted]d being seen today for ongoing prenatal care.  She is currently monitored for the following issues for this high-risk pregnancy and has Supervision of high risk pregnancy, antepartum; Gestational diabetes mellitus, class A2; and Abnormal maternal serum screening test on her problem list.  Patient reports no complaints.  Contractions: Not present. Vag. Bleeding: None.  Movement: Present. Denies leaking of fluid.   The following portions of the patient's history were reviewed and updated as appropriate: allergies, current medications, past family history, past medical history, past social history, past surgical history and problem list. Problem list updated.  Objective:   Vitals:   01/06/17 1123  BP: 112/69  Pulse: 83  Weight: 168 lb 4.8 oz (76.3 kg)    Fetal Status: Fetal Heart Rate (bpm): 150   Movement: Present     General:  Alert, oriented and cooperative. Patient is in no acute distress.  Skin: Skin is warm and dry. No rash noted.   Cardiovascular: Normal heart rate noted  Respiratory: Normal respiratory effort, no problems with respiration noted  Abdomen: Soft, gravid, appropriate for gestational age. Pain/Pressure: Absent     Pelvic:  Cervical exam deferred        Extremities: Normal range of motion.  Edema: None  Mental Status: Normal mood and affect. Normal behavior. Normal judgment and thought content.  Takes her meds daily and follows diet no BS x 9 days FBS 72-108 one in range 2 hr pp 90-160 most in range Assessment and Plan:  Pregnancy: G1P0000 at [redacted]w[redacted]d  1. Gestational diabetes mellitus, class A2 Add protein to bedtime snack to decrease fastings Pick up strips Continue Glyburide Needs ASA  2. Supervision of high risk pregnancy, antepartum Continue prenatal care. Has f/u to complete anatomy   General obstetric precautions including but not limited to vaginal bleeding, contractions,  leaking of fluid and fetal movement were reviewed in detail with the patient. Please refer to After Visit Summary for other counseling recommendations.  Return in 3 weeks (on 01/27/2017) for [facility].   Reva Bores, MD

## 2017-01-15 ENCOUNTER — Other Ambulatory Visit (HOSPITAL_COMMUNITY): Payer: Self-pay | Admitting: Maternal and Fetal Medicine

## 2017-01-15 ENCOUNTER — Encounter (HOSPITAL_COMMUNITY): Payer: Self-pay

## 2017-01-15 ENCOUNTER — Other Ambulatory Visit (HOSPITAL_COMMUNITY): Payer: Self-pay | Admitting: *Deleted

## 2017-01-15 ENCOUNTER — Ambulatory Visit (HOSPITAL_COMMUNITY)
Admission: RE | Admit: 2017-01-15 | Discharge: 2017-01-15 | Disposition: A | Discharge status: Home / Self Care | Payer: Medicaid Other | Source: Ambulatory Visit | Attending: Obstetrics & Gynecology | Admitting: Obstetrics & Gynecology

## 2017-01-15 DIAGNOSIS — O26872 Cervical shortening, second trimester: Secondary | ICD-10-CM | POA: Diagnosis not present

## 2017-01-15 DIAGNOSIS — O24415 Gestational diabetes mellitus in pregnancy, controlled by oral hypoglycemic drugs: Secondary | ICD-10-CM | POA: Diagnosis not present

## 2017-01-15 DIAGNOSIS — IMO0002 Reserved for concepts with insufficient information to code with codable children: Secondary | ICD-10-CM

## 2017-01-15 DIAGNOSIS — Z3A20 20 weeks gestation of pregnancy: Secondary | ICD-10-CM

## 2017-01-15 DIAGNOSIS — Z0489 Encounter for examination and observation for other specified reasons: Secondary | ICD-10-CM

## 2017-01-15 DIAGNOSIS — Z362 Encounter for other antenatal screening follow-up: Secondary | ICD-10-CM | POA: Insufficient documentation

## 2017-01-15 DIAGNOSIS — O26879 Cervical shortening, unspecified trimester: Secondary | ICD-10-CM

## 2017-01-20 ENCOUNTER — Ambulatory Visit (HOSPITAL_COMMUNITY)
Admission: RE | Admit: 2017-01-20 | Discharge: 2017-01-20 | Disposition: A | Discharge status: Home / Self Care | Payer: Medicaid Other | Source: Ambulatory Visit | Attending: Obstetrics & Gynecology | Admitting: Obstetrics & Gynecology

## 2017-01-20 ENCOUNTER — Encounter (HOSPITAL_COMMUNITY): Payer: Self-pay

## 2017-01-20 DIAGNOSIS — Z3A21 21 weeks gestation of pregnancy: Secondary | ICD-10-CM | POA: Diagnosis not present

## 2017-01-20 DIAGNOSIS — O26879 Cervical shortening, unspecified trimester: Secondary | ICD-10-CM

## 2017-01-20 DIAGNOSIS — O26872 Cervical shortening, second trimester: Secondary | ICD-10-CM | POA: Insufficient documentation

## 2017-01-20 DIAGNOSIS — O24415 Gestational diabetes mellitus in pregnancy, controlled by oral hypoglycemic drugs: Secondary | ICD-10-CM | POA: Diagnosis not present

## 2017-01-20 DIAGNOSIS — Z7984 Long term (current) use of oral hypoglycemic drugs: Secondary | ICD-10-CM | POA: Insufficient documentation

## 2017-01-21 ENCOUNTER — Other Ambulatory Visit (HOSPITAL_COMMUNITY): Payer: Self-pay | Admitting: *Deleted

## 2017-01-21 DIAGNOSIS — O26879 Cervical shortening, unspecified trimester: Secondary | ICD-10-CM

## 2017-01-27 ENCOUNTER — Other Ambulatory Visit (HOSPITAL_COMMUNITY)
Admission: RE | Admit: 2017-01-27 | Discharge: 2017-01-27 | Disposition: A | Discharge status: Home / Self Care | Payer: Medicaid Other | Source: Ambulatory Visit | Attending: Obstetrics & Gynecology | Admitting: Obstetrics & Gynecology

## 2017-01-27 ENCOUNTER — Ambulatory Visit (INDEPENDENT_AMBULATORY_CARE_PROVIDER_SITE_OTHER): Payer: Medicaid Other | Admitting: Obstetrics & Gynecology

## 2017-01-27 DIAGNOSIS — O0990 Supervision of high risk pregnancy, unspecified, unspecified trimester: Secondary | ICD-10-CM

## 2017-01-27 DIAGNOSIS — O0992 Supervision of high risk pregnancy, unspecified, second trimester: Secondary | ICD-10-CM

## 2017-01-27 DIAGNOSIS — R0981 Nasal congestion: Secondary | ICD-10-CM | POA: Insufficient documentation

## 2017-01-27 DIAGNOSIS — O24419 Gestational diabetes mellitus in pregnancy, unspecified control: Secondary | ICD-10-CM | POA: Diagnosis not present

## 2017-01-27 DIAGNOSIS — Z3A22 22 weeks gestation of pregnancy: Secondary | ICD-10-CM | POA: Diagnosis not present

## 2017-01-27 DIAGNOSIS — O280 Abnormal hematological finding on antenatal screening of mother: Secondary | ICD-10-CM

## 2017-01-27 LAB — GLUCOSE, CAPILLARY: Glucose-Capillary: 114 mg/dL — ABNORMAL HIGH (ref 65–99)

## 2017-01-27 MED ORDER — CETIRIZINE-PSEUDOEPHEDRINE ER 5-120 MG PO TB12: 1.0000 | ORAL_TABLET | Freq: Two times a day (BID) | ORAL | 2 refills | Status: DC

## 2017-01-27 NOTE — Addendum Note (Signed)
Addended by: Faythe Casa on: 01/27/2017 03:17 PM   Modules accepted: Orders

## 2017-01-27 NOTE — Progress Notes (Signed)
[employer] # [phone] Contacted pt's [facility] [place] and refilled pt's PNV and also was informed that they have lancets in stock now that has also been refilled,.  Pt notified.     PRENATAL VISIT NOTE  Subjective:  Tiffany Crawford is a 36 y.o. G1P0000 at [redacted]w[redacted]d being seen today for ongoing prenatal care.  She is currently monitored for the following issues for this high-risk pregnancy and has Supervision of high risk pregnancy, antepartum; Gestational diabetes mellitus, class A2; Abnormal maternal serum screening test; and Nasal congestion on her problem list.  Patient reports nasal stuffiness not responding to claritin.  Appears that she has been on a z pack this year..  Contractions: Not present. Vag. Bleeding: None.  Movement: Present. Denies leaking of fluid. Pt has no pelvic pressure nor vaginal bleeding.    The following portions of the patient's history were reviewed and updated as appropriate: allergies, current medications, past family history, past medical history, past social history, past surgical history and problem list. Problem list updated.  Objective:   Vitals:   01/27/17 1014  BP: 111/74  Pulse: 87  Weight: 172 lb 8 oz (78.2 kg)    Fetal Status: Fetal Heart Rate (bpm): 152   Movement: Present     General:  Alert, oriented and cooperative. Patient is in no acute distress.  Skin: Skin is warm and dry. No rash noted.   Cardiovascular: Normal heart rate noted  Respiratory: Normal respiratory effort, no problems with respiration noted  Abdomen: Soft, gravid, appropriate for gestational age. Pain/Pressure: Absent     Pelvic:  Cervical exam performed      soft external os, internal os not reached due to posterior (did not force); cervix about 50%.  Copius greenish curdlike discharge--bd affirm sent  Extremities: Normal range of motion.  Edema: None  Mental Status: Normal mood and affect. Normal behavior. Normal judgment and thought content.   Assessment and Plan:   Pregnancy: G1P0000 at [redacted]w[redacted]d  1. Abnormal maternal serum screening test -Will recalculate with new due date.  J Bellany to call lab.  2. GDM--worsening Pt not taking CBGs due to [facility] being out of her strips.  See above note.    3. Nasal congestion--worsening -change from claritin to Zyrtec D.  If no improvement then add nasal steroid.  4.  Short cervix -TVUS in one week with possible steroids -BD affirm sent; possible yeast infection.  Preterm labor symptoms and general obstetric precautions including but not limited to vaginal bleeding, contractions, leaking of fluid and fetal movement were reviewed in detail with the patient. Please refer to After Visit Summary for other counseling recommendations.  Return in about 2 weeks (around 02/10/2017).   Lesly Dukes, MD

## 2017-01-28 LAB — CERVICOVAGINAL ANCILLARY ONLY
Bacterial vaginitis: NEGATIVE
CANDIDA VAGINITIS: POSITIVE — AB
TRICH (WINDOWPATH): NEGATIVE

## 2017-01-30 ENCOUNTER — Telehealth: Payer: Self-pay | Admitting: General Practice

## 2017-01-30 DIAGNOSIS — B379 Candidiasis, unspecified: Secondary | ICD-10-CM

## 2017-01-30 MED ORDER — TERCONAZOLE 0.4 % VA CREA: 1.0000 | TOPICAL_CREAM | Freq: Every day | VAGINAL | 0 refills | Status: DC

## 2017-01-30 NOTE — Telephone Encounter (Signed)
-----   Message from Hermina StaggersMichael L Ervin, MD sent at 01/29/2017  8:13 AM EDT ----- Radene Ouerazol 1 applicator qhs x 7 days Thanks Casimiro NeedleMichael

## 2017-01-30 NOTE — Telephone Encounter (Signed)
Called patient with pacific interpreter [phone], no answer- phone continued to ring. No voicemail. Will send letter

## 2017-02-03 ENCOUNTER — Ambulatory Visit (HOSPITAL_COMMUNITY)
Admission: RE | Admit: 2017-02-03 | Discharge: 2017-02-03 | Disposition: A | Discharge status: Home / Self Care | Payer: Medicaid Other | Source: Ambulatory Visit | Attending: Obstetrics & Gynecology | Admitting: Obstetrics & Gynecology

## 2017-02-03 ENCOUNTER — Other Ambulatory Visit (HOSPITAL_COMMUNITY): Payer: Self-pay | Admitting: *Deleted

## 2017-02-03 ENCOUNTER — Other Ambulatory Visit (HOSPITAL_COMMUNITY): Payer: Self-pay | Admitting: Maternal and Fetal Medicine

## 2017-02-03 ENCOUNTER — Encounter (HOSPITAL_COMMUNITY): Payer: Self-pay

## 2017-02-03 DIAGNOSIS — Z3A23 23 weeks gestation of pregnancy: Secondary | ICD-10-CM | POA: Insufficient documentation

## 2017-02-03 DIAGNOSIS — O26872 Cervical shortening, second trimester: Secondary | ICD-10-CM

## 2017-02-03 DIAGNOSIS — O26879 Cervical shortening, unspecified trimester: Secondary | ICD-10-CM

## 2017-02-03 DIAGNOSIS — O24415 Gestational diabetes mellitus in pregnancy, controlled by oral hypoglycemic drugs: Secondary | ICD-10-CM

## 2017-02-03 MED ORDER — BETAMETHASONE SOD PHOS & ACET 6 (3-3) MG/ML IJ SUSP
12.0000 mg | Freq: Once | INTRAMUSCULAR | Status: AC
  Administered 2017-02-03: 12 mg via INTRAMUSCULAR
  Filled 2017-02-03: qty 2

## 2017-02-04 ENCOUNTER — Inpatient Hospital Stay (HOSPITAL_COMMUNITY)
Admission: AD | Admit: 2017-02-04 | Discharge: 2017-02-06 | DRG: 775 | Disposition: A | Discharge status: Home / Self Care | Payer: Medicaid Other | Source: Ambulatory Visit | Attending: Obstetrics & Gynecology | Admitting: Obstetrics & Gynecology

## 2017-02-04 ENCOUNTER — Encounter (HOSPITAL_COMMUNITY): Payer: Self-pay | Admitting: *Deleted

## 2017-02-04 ENCOUNTER — Ambulatory Visit (HOSPITAL_COMMUNITY): Payer: Medicaid Other

## 2017-02-04 DIAGNOSIS — O24420 Gestational diabetes mellitus in childbirth, diet controlled: Secondary | ICD-10-CM | POA: Diagnosis present

## 2017-02-04 DIAGNOSIS — Z3A23 23 weeks gestation of pregnancy: Secondary | ICD-10-CM | POA: Diagnosis not present

## 2017-02-04 DIAGNOSIS — Z720 Tobacco use: Secondary | ICD-10-CM

## 2017-02-04 DIAGNOSIS — O3432 Maternal care for cervical incompetence, second trimester: Principal | ICD-10-CM | POA: Diagnosis present

## 2017-02-04 DIAGNOSIS — O24419 Gestational diabetes mellitus in pregnancy, unspecified control: Secondary | ICD-10-CM

## 2017-02-04 DIAGNOSIS — O42919 Preterm premature rupture of membranes, unspecified as to length of time between rupture and onset of labor, unspecified trimester: Secondary | ICD-10-CM | POA: Diagnosis present

## 2017-02-04 DIAGNOSIS — IMO0001 Reserved for inherently not codable concepts without codable children: Secondary | ICD-10-CM

## 2017-02-04 DIAGNOSIS — O42912 Preterm premature rupture of membranes, unspecified as to length of time between rupture and onset of labor, second trimester: Secondary | ICD-10-CM | POA: Diagnosis present

## 2017-02-04 DIAGNOSIS — O42012 Preterm premature rupture of membranes, onset of labor within 24 hours of rupture, second trimester: Secondary | ICD-10-CM | POA: Diagnosis not present

## 2017-02-04 DIAGNOSIS — O3430 Maternal care for cervical incompetence, unspecified trimester: Secondary | ICD-10-CM

## 2017-02-04 DIAGNOSIS — R1030 Lower abdominal pain, unspecified: Secondary | ICD-10-CM | POA: Diagnosis present

## 2017-02-04 DIAGNOSIS — O280 Abnormal hematological finding on antenatal screening of mother: Secondary | ICD-10-CM

## 2017-02-04 DIAGNOSIS — O0990 Supervision of high risk pregnancy, unspecified, unspecified trimester: Secondary | ICD-10-CM

## 2017-02-04 DIAGNOSIS — Z789 Other specified health status: Secondary | ICD-10-CM | POA: Diagnosis not present

## 2017-02-04 LAB — URINALYSIS, MICROSCOPIC (REFLEX)

## 2017-02-04 LAB — URINALYSIS, ROUTINE W REFLEX MICROSCOPIC
Bilirubin Urine: NEGATIVE
Glucose, UA: NEGATIVE mg/dL
KETONES UR: 40 mg/dL — AB
LEUKOCYTES UA: NEGATIVE
NITRITE: NEGATIVE
PH: 7.5 (ref 5.0–8.0)
PROTEIN: NEGATIVE mg/dL
Specific Gravity, Urine: 1.015 (ref 1.005–1.030)

## 2017-02-04 LAB — CBC
HEMATOCRIT: 31.6 % — AB (ref 36.0–46.0)
Hemoglobin: 10.6 g/dL — ABNORMAL LOW (ref 12.0–15.0)
MCH: 28.5 pg (ref 26.0–34.0)
MCHC: 33.5 g/dL (ref 30.0–36.0)
MCV: 84.9 fL (ref 78.0–100.0)
Platelets: 253 10*3/uL (ref 150–400)
RBC: 3.72 MIL/uL — AB (ref 3.87–5.11)
RDW: 14.4 % (ref 11.5–15.5)
WBC: 25 10*3/uL — AB (ref 4.0–10.5)

## 2017-02-04 LAB — TYPE AND SCREEN
ABO/RH(D): B POS
ANTIBODY SCREEN: NEGATIVE

## 2017-02-04 LAB — ABO/RH: ABO/RH(D): B POS

## 2017-02-04 MED ORDER — PRENATAL MULTIVITAMIN CH
1.0000 | ORAL_TABLET | Freq: Every day | ORAL | Status: DC
  Administered 2017-02-04 – 2017-02-05 (×2): 1 via ORAL
  Filled 2017-02-04 (×2): qty 1

## 2017-02-04 MED ORDER — DIBUCAINE 1 % RE OINT: 1.0000 "application " | TOPICAL_OINTMENT | RECTAL | Status: DC | PRN

## 2017-02-04 MED ORDER — TETANUS-DIPHTH-ACELL PERTUSSIS 5-2.5-18.5 LF-MCG/0.5 IM SUSP
0.5000 mL | Freq: Once | INTRAMUSCULAR | Status: AC
  Administered 2017-02-05: 0.5 mL via INTRAMUSCULAR

## 2017-02-04 MED ORDER — LIDOCAINE HCL (PF) 1 % IJ SOLN
INTRAMUSCULAR | Status: AC
  Filled 2017-02-04: qty 30

## 2017-02-04 MED ORDER — WITCH HAZEL-GLYCERIN EX PADS: 1.0000 "application " | MEDICATED_PAD | CUTANEOUS | Status: DC | PRN

## 2017-02-04 MED ORDER — SIMETHICONE 80 MG PO CHEW: 80.0000 mg | CHEWABLE_TABLET | ORAL | Status: DC | PRN

## 2017-02-04 MED ORDER — SENNOSIDES-DOCUSATE SODIUM 8.6-50 MG PO TABS
2.0000 | ORAL_TABLET | ORAL | Status: DC
  Administered 2017-02-05 – 2017-02-06 (×2): 2 via ORAL
  Filled 2017-02-04 (×2): qty 2

## 2017-02-04 MED ORDER — LACTATED RINGERS IV SOLN: 500.0000 mL | INTRAVENOUS | Status: DC | PRN

## 2017-02-04 MED ORDER — IBUPROFEN 600 MG PO TABS
600.0000 mg | ORAL_TABLET | Freq: Four times a day (QID) | ORAL | Status: DC
  Administered 2017-02-04 – 2017-02-06 (×9): 600 mg via ORAL
  Filled 2017-02-04 (×9): qty 1

## 2017-02-04 MED ORDER — BETAMETHASONE SOD PHOS & ACET 6 (3-3) MG/ML IJ SUSP: 12.0000 mg | Freq: Once | INTRAMUSCULAR | Status: DC

## 2017-02-04 MED ORDER — COCONUT OIL OIL: 1.0000 "application " | TOPICAL_OIL | Status: DC | PRN

## 2017-02-04 MED ORDER — MAGNESIUM SULFATE 40 G IN LACTATED RINGERS - SIMPLE
2.0000 g/h | INTRAVENOUS | Status: DC
  Filled 2017-02-04: qty 500

## 2017-02-04 MED ORDER — ZOLPIDEM TARTRATE 5 MG PO TABS
5.0000 mg | ORAL_TABLET | Freq: Every evening | ORAL | Status: DC | PRN
Start: 2017-02-04 — End: 2017-02-06

## 2017-02-04 MED ORDER — NIFEDIPINE 10 MG PO CAPS
10.0000 mg | ORAL_CAPSULE | ORAL | Status: DC
  Administered 2017-02-04: 10 mg via ORAL
  Filled 2017-02-04: qty 1

## 2017-02-04 MED ORDER — OXYTOCIN 40 UNITS IN LACTATED RINGERS INFUSION - SIMPLE MED
2.5000 [IU]/h | INTRAVENOUS | Status: DC
  Filled 2017-02-04: qty 1000

## 2017-02-04 MED ORDER — ONDANSETRON HCL 4 MG PO TABS: 4.0000 mg | ORAL_TABLET | ORAL | Status: DC | PRN

## 2017-02-04 MED ORDER — PENICILLIN G POTASSIUM 5000000 UNITS IJ SOLR
5.0000 10*6.[IU] | Freq: Once | INTRAVENOUS | Status: DC
  Filled 2017-02-04: qty 5

## 2017-02-04 MED ORDER — OXYTOCIN BOLUS FROM INFUSION
500.0000 mL | Freq: Once | INTRAVENOUS | Status: AC
  Administered 2017-02-04: 500 mL via INTRAVENOUS

## 2017-02-04 MED ORDER — OXYCODONE-ACETAMINOPHEN 5-325 MG PO TABS: 2.0000 | ORAL_TABLET | ORAL | Status: DC | PRN

## 2017-02-04 MED ORDER — BENZOCAINE-MENTHOL 20-0.5 % EX AERO
1.0000 "application " | INHALATION_SPRAY | CUTANEOUS | Status: DC | PRN
  Administered 2017-02-04: 1 via TOPICAL
  Filled 2017-02-04: qty 56

## 2017-02-04 MED ORDER — FENTANYL CITRATE (PF) 100 MCG/2ML IJ SOLN: 50.0000 ug | INTRAMUSCULAR | Status: DC | PRN

## 2017-02-04 MED ORDER — LACTATED RINGERS IV BOLUS (SEPSIS)
1000.0000 mL | Freq: Once | INTRAVENOUS | Status: AC
  Administered 2017-02-04: 1000 mL via INTRAVENOUS

## 2017-02-04 MED ORDER — BETAMETHASONE SOD PHOS & ACET 6 (3-3) MG/ML IJ SUSP
12.0000 mg | Freq: Once | INTRAMUSCULAR | Status: AC
  Administered 2017-02-04: 12 mg via INTRAMUSCULAR
  Filled 2017-02-04: qty 2

## 2017-02-04 MED ORDER — LACTATED RINGERS IV SOLN: INTRAVENOUS | Status: DC

## 2017-02-04 MED ORDER — MAGNESIUM SULFATE BOLUS VIA INFUSION
4.0000 g | Freq: Once | INTRAVENOUS | Status: AC
  Administered 2017-02-04: 4 g via INTRAVENOUS
  Filled 2017-02-04: qty 500

## 2017-02-04 MED ORDER — OXYCODONE-ACETAMINOPHEN 5-325 MG PO TABS: 1.0000 | ORAL_TABLET | ORAL | Status: DC | PRN

## 2017-02-04 MED ORDER — DIPHENHYDRAMINE HCL 25 MG PO CAPS: 25.0000 mg | ORAL_CAPSULE | Freq: Four times a day (QID) | ORAL | Status: DC | PRN

## 2017-02-04 MED ORDER — ONDANSETRON HCL 4 MG/2ML IJ SOLN
4.0000 mg | Freq: Four times a day (QID) | INTRAMUSCULAR | Status: DC | PRN
  Administered 2017-02-04: 4 mg via INTRAVENOUS
  Filled 2017-02-04: qty 2

## 2017-02-04 MED ORDER — ACETAMINOPHEN 325 MG PO TABS: 650.0000 mg | ORAL_TABLET | ORAL | Status: DC | PRN

## 2017-02-04 MED ORDER — SOD CITRATE-CITRIC ACID 500-334 MG/5ML PO SOLN: 30.0000 mL | ORAL | Status: DC | PRN

## 2017-02-04 MED ORDER — ACETAMINOPHEN 325 MG PO TABS
650.0000 mg | ORAL_TABLET | ORAL | Status: DC | PRN
  Administered 2017-02-05: 650 mg via ORAL
  Filled 2017-02-04: qty 2

## 2017-02-04 MED ORDER — LIDOCAINE HCL (PF) 1 % IJ SOLN
30.0000 mL | INTRAMUSCULAR | Status: DC | PRN
  Filled 2017-02-04: qty 30

## 2017-02-04 MED ORDER — ONDANSETRON HCL 4 MG/2ML IJ SOLN: 4.0000 mg | INTRAMUSCULAR | Status: DC | PRN

## 2017-02-04 MED ORDER — PENICILLIN G POT IN DEXTROSE 60000 UNIT/ML IV SOLN
3.0000 10*6.[IU] | INTRAVENOUS | Status: DC
  Filled 2017-02-04 (×3): qty 50

## 2017-02-04 NOTE — MAU Note (Signed)
Pt reports pain that comes and goes every every minute. The pain is in her lower abdomen and lower back. The pain started around 8pm. Denies LOF or vaginal bleeding. + FM.

## 2017-02-04 NOTE — H&P (Signed)
Tiffany Crawford is a 36 y.o. female presenting for contractions.  Had a pessary placed yesterday for incompetent cervix.  On exam tonight, she was found to be leaking copious fluid, so was admitted with approval by NICU.Marland Kitchen  MAU Note: Tiffany Crawford is a 36 y.o. G1P0000 at [redacted]w[redacted]d who presents today with lower abdominal pain. She had a pessary placed at MFM yesterday. She had first dose of BMZ at that time as well (02/03/17 around 10:00am). She denies any VB or LOF. She reports normal fetal movement.   OB History    Gravida Para Term Preterm AB Living   1 1 0 1 0 1   SAB TAB Ectopic Multiple Live Births   0 0 0 0 1     Past Medical History:  Diagnosis Date  . Medical history non-contributory    Past Surgical History:  Procedure Laterality Date  . NO PAST SURGERIES     Family History: family history includes Hypertension in her mother and sister; Kidney disease in her mother and sister. Social History:  reports that she has never smoked. She has quit using smokeless tobacco. Her smokeless tobacco use included Chew. She reports that she does not drink alcohol or use drugs.     Maternal Diabetes: Yes:  Diabetes Type:  Diet controlled Genetic Screening: Declined Maternal Ultrasounds/Referrals: Abnormal:  Findings:   Other:no cervical length Fetal Ultrasounds or other Referrals:  None Maternal Substance Abuse:  No Significant Maternal Medications:  None Significant Maternal Lab Results:  None Other Comments:  incompetent cervix, has had Betamethasone and started on MgSO4  Review of Systems  Constitutional: Negative for chills, fever and malaise/fatigue.  Cardiovascular: Negative for leg swelling.  Gastrointestinal: Positive for abdominal pain. Negative for constipation, diarrhea, nausea and vomiting.   Maternal Medical History:  Reason for admission: Rupture of membranes, contractions and vaginal bleeding.  Nausea.  Contractions: Onset was 1-2 hours ago.   Frequency: irregular.   Perceived  severity is moderate.    Fetal activity: Perceived fetal activity is normal.   Last perceived fetal movement was within the past hour.    Prenatal complications: Bleeding and preterm labor.   No PIH or pre-eclampsia.   Prenatal Complications - Diabetes: gestational. Diabetes is managed by diet.      Dilation: 10 Effacement (%): 100 Station: +2 Exam by:: Artelia Laroche CNM Blood pressure 105/67, pulse 96, temperature 98.5 F (36.9 C), resp. rate 18, last menstrual period 07/24/2016, SpO2 99 %, unknown if currently breastfeeding. Maternal Exam:  Uterine Assessment: Contraction strength is moderate.  Contraction frequency is irregular.   Abdomen: Patient reports no abdominal tenderness. Fundal height is 24.   Fetal presentation: vertex  Introitus: Normal vulva. Normal vagina.  Ferning test: not done.  Nitrazine test: not done. Amniotic fluid character: bloody.  Pelvis: adequate for delivery.   Cervix: Cervix evaluated by digital exam.     Fetal Exam Fetal Monitor Review: Mode: ultrasound.   Baseline rate: 150.  Variability: minimal (<5 bpm).   Pattern: no accelerations and no decelerations.    Fetal State Assessment: Category II - tracings are indeterminate.     Physical Exam  Constitutional: She is oriented to person, place, and time. She appears well-developed and well-nourished. No distress (but uncomfortable).  HENT:  Head: Normocephalic.  Cardiovascular: Normal rate, regular rhythm and normal heart sounds.   Respiratory: Effort normal. No respiratory distress.  GI: Soft. She exhibits no distension. There is no tenderness. There is no rebound and no guarding.  Genitourinary:  Genitourinary Comments: Cervix completely dilated with vertex in vault Station +1 BBOW with gross pooling, blood tinged  Musculoskeletal: Normal range of motion.  Neurological: She is alert and oriented to person, place, and time.  Skin: Skin is warm and dry.  Psychiatric: She has a normal  mood and affect.    Prenatal labs: ABO, Rh: --/--/B POS (05/08 0141) Antibody: NEG (05/08 0141) Rubella:   RPR: Nonreactive (03/01 0000)  HBsAg: Negative (03/01 0000)  HIV: Non-reactive (03/01 0000)  GBS:     Assessment/Plan: Single IUP at 8431w5d PPROM Preterm labor Incompetent cervix, now completely dilated  Admit to [facility] per Dr Debroah LoopArnold NICU aware Betamethasone Magnesium sulfate neuroprophylaxis Prepare for delivery    Wynelle BourgeoisMarie Jakki Doughty 02/04/2017, 3:42 AM

## 2017-02-04 NOTE — MAU Provider Note (Signed)
History     CSN: [id number]  Arrival date and time: 02/04/17 0111   First Provider Initiated Contact with Patient 02/04/17 0120      Chief Complaint  Patient presents with  . Pelvic Pain   Tiffany Crawford is a 36 y.o. G1P0000 at 767w5d who presents today with lower abdominal pain. She had a pessary placed at MFM yesterday. She had first dose of BMZ at that time as well (02/03/17 around 10:00am). She denies any VB or LOF. She reports normal fetal movement.    Pelvic Pain  The patient's primary symptoms include pelvic pain. The patient's pertinent negatives include no vaginal discharge. This is a new problem. The current episode started today. The problem occurs intermittently (about every 3-5 mins ). Associated symptoms include dysuria. Pertinent negatives include no chills, fever, nausea or vomiting.    Past Medical History:  Diagnosis Date  . Medical history non-contributory     Past Surgical History:  Procedure Laterality Date  . NO PAST SURGERIES      Family History  Problem Relation Age of Onset  . Hypertension Mother   . Kidney disease Mother   . Hypertension Sister   . Kidney disease Sister     Social History  Substance Use Topics  . Smoking status: Never Smoker  . Smokeless tobacco: Former NeurosurgeonUser    Types: Chew  . Alcohol use No    Allergies: No Known Allergies  Prescriptions Prior to Admission  Medication Sig Dispense Refill Last Dose  . ACCU-CHEK FASTCLIX LANCETS MISC 1 Device by Percutaneous route 4 (four) times daily. 100 each 12 Taking  . cetirizine-pseudoephedrine (ZYRTEC-D ALLERGY & CONGESTION) 5-120 MG tablet Take 1 tablet by mouth 2 (two) times daily. 60 tablet 2 Taking  . doxylamine, Sleep, (UNISOM) 25 MG tablet Take 25 mg by mouth at bedtime as needed.   Not Taking  . glucose blood (ACCU-CHEK GUIDE) test strip Use as instructed 100 each 12 Taking  . glyBURIDE (DIABETA) 2.5 MG tablet Take one tablet in the morning with meal and 2 tablet before bed 90 tablet  3 Taking  . ondansetron (ZOFRAN ODT) 4 MG disintegrating tablet Take 1 tablet (4 mg total) by mouth every 8 (eight) hours as needed for nausea or vomiting. (Patient not taking: Reported on 02/03/2017) 20 tablet 0 Not Taking  . Prenatal Vit-Fe Fumarate-FA (PREPLUS) 27-1 MG TABS Take 1 tablet by mouth daily. 30 tablet 11 Taking  . progesterone (PROMETRIUM) 200 MG capsule PLACE 1 CAP PER VAGINA NIGHTLY AT BEDTIME  3 Taking  . PROGESTERONE VA Place vaginally.   Taking  . pyridOXINE (VITAMIN B-6) 100 MG tablet Take 100 mg by mouth daily.   Not Taking  . terconazole (TERAZOL 7) 0.4 % vaginal cream Place 1 applicator vaginally at bedtime. (Patient not taking: Reported on 02/03/2017) 45 g 0 Not Taking    Review of Systems  Constitutional: Negative for chills and fever.  Gastrointestinal: Negative for nausea and vomiting.  Genitourinary: Positive for dysuria and pelvic pain. Negative for vaginal bleeding and vaginal discharge.   Physical Exam   Blood pressure 112/69, pulse 92, temperature 98 F (36.7 C), resp. rate 18, last menstrual period 07/24/2016.  Physical Exam  Nursing note and vitals reviewed. Constitutional: She is oriented to person, place, and time. She appears well-developed and well-nourished. No distress.  HENT:  Head: Normocephalic.  Cardiovascular: Normal rate.   Respiratory: Effort normal.  GI: Soft. There is no tenderness. There is no rebound.  Genitourinary:  Genitourinary Comments: External: no lesion Vagina: pooling of watery, blood tinged fluid  Cervix: not visualized, pessary and fluid obstructing view  Uterus: AGA   Neurological: She is alert and oriented to person, place, and time.  Skin: Skin is warm and dry.  Psychiatric: She has a normal mood and affect.   Bedside US: vertex  FHT: 150, moderate, repetitive variables  Toco: not tracing, but contractions seem to be about every 3 mins  MAU Course  Procedures  MDM 0137: D/W Dr. Debroah Loop, ok to check with pessary.   2021: D/W Dr. Debroah Loop, SROM. Will give 2nd betamethasone now, and mag for neuro protection.   Assessment and Plan   1. Preterm premature rupture of membranes (PPROM) with unknown onset of labor   2. Abnormal maternal serum screening test   3. Supervision of high risk pregnancy, antepartum   4. Gestational diabetes mellitus, class A2    Admit to labor and delivery  Mag for neuro protection 2nd betamethasone now  Thressa Sheller 02/04/2017, 1:30 AM

## 2017-02-04 NOTE — Progress Notes (Signed)
I offered support over several visits with the family on OB High Risk as well as in the NICU.  In my first visit with them this morning, they stated that it was very emotional to see their child in such a condition.  They seemed a little more comfortable this afternoon.  MOB requested that I keep her baby in prayer.  We will continue to follow for support.  Chaplain Dyanne CarrelKaty Analese Sovine, Bcc Pager, [phone] 4:29 PM    02/04/17 1600  Clinical Encounter Type  Visited With Patient and family together  Visit Type Spiritual support  Referral From Nurse  Spiritual Encounters  Spiritual Needs Prayer

## 2017-02-04 NOTE — Lactation Note (Signed)
This note was copied from a baby's chart. Lactation Consultation Note  Patient Name: Tiffany Crawford Today's Date: 02/04/2017 Reason for consult: Initial assessment;NICU baby Initial visit made with pacific interpreter via phone.  Explained to mom that we need to start pumping every 3 hours to establish milk supply.  Discussed the benefits of breast milk for her NICU baby.  Mom shown hand expression but no colostrum seen and mom uncomfortable.  DEBP initiated.  Mom will pump for 15 minutes.  Reassured she may not see any milk and milk coming to volume discussed.  Mom asking if she can use formula too when baby comes home.  Recommended she use as much breast milk as possible especially while baby in NICU. Formula can be used at home as needed.  [facility]  Encouraged to call with concerns/assist.  Maternal Data    Feeding    LATCH Score/Interventions                      Lactation Tools Discussed/Used WIC Program: Yes Pump Review: Setup, frequency, and cleaning;Milk Storage Initiated by:: LC Date initiated:: 02/04/17   Consult Status Consult Status: Follow-up Date: 02/05/17 Follow-up type: In-patient    Huston FoleyMOULDEN, Yarelie Hams S 02/04/2017, 5:09 PM

## 2017-02-05 LAB — RPR: RPR Ser Ql: NONREACTIVE

## 2017-02-05 NOTE — Plan of Care (Signed)
Problem: Skin Integrity: Goal: Risk for impaired skin integrity will decrease Outcome: Progressing Pt is ambulating independently.  Problem: Activity: Goal: Risk for activity intolerance will decrease Outcome: Progressing Pt is ambulating to NICU with FOB to visit baby.

## 2017-02-05 NOTE — Progress Notes (Signed)
CLINICAL SOCIAL WORK MATERNAL/CHILD NOTE  Patient Details  Name: Boy Vela Keilman MRN: [id number] Date of Birth: 02/04/2015  Date:  02/05/2017  Clinical Social Worker Initiating Note:  Terri Piedra, LCSW Date/ Time Initiated:  02/05/17/1100     Child's Name:  Ruben Im Muhammed   Legal Guardian:  Other (Comment) (Parents: Shemeca Ka Read and MaMet Pe)   Need for Interpreter:  Other (Comment Required) (Burmese)   Date of Referral:   (No referral-NICU admission)     Reason for Referral:      Referral Source:      Address:  [address] [place], [place] [address]  Phone number:  [phone]   Household Members:  Spouse   Natural Supports (not living in the home):  Extended Family, Immediate Family (MOB reports that their family is all here in [place].  She states her younger brother can speak [place] fluently.)   Professional Supports: None   Employment:     Type of Work: MOB is not currently working.  FOB states he is employed, but cannot remember the name of the company.  He described his job as mixing fruit and nuts into chocolate.   Education:      Museum/gallery curator Resources:  Medicaid   Other Resources:      Cultural/Religious Considerations Which May Impact Care: None stated.    Strengths:   (CSW unsure at this time.  Parents appear supportive and state they have a good support system.  They report that they will be able to come to the hospital daily after MOB's discharge.)   Risk Factors/Current Problems:  Adjustment to Illness    Cognitive State:  Able to Concentrate , Alert , Linear Thinking    Mood/Affect:  Calm , Tearful , Interested    CSW Assessment: CSW met with parents in MOB's third floor room/318 to introduce services, offer support, and complete assessment due to baby's admission to NICU at 23.5 weeks.  CSW utilized a Ecologist from [employer] in order to communicate with parents.  MOB reports that she speaks some [place].  CSW feels that a  language barrier remained even though there was an interpreter.  Interpreter needed to clarify almost everything that was stated by parents.  Interpreter thinks this is due to the area that parents are from based on FOB's name.   CSW learned that parents have been married for a year.  FOB immigrated to [place] a year ago and came to [place] to be with MOB.  MOB states she is originally from [place] and that her family left [place] when she was 57.  She states she lived in a [place] in [place] for the last 15 years prior to moving to [place].  Parents report that they met when they were neighbors in the camp.  MOB reports that their family is here in [place] with them and that they are glad to be here.   Parents state understanding that they may come to the hospital to see baby any time and state that they plan to come at least once per day.  They have a car.  CSW did not discuss preparations at this time given baby's gestation.   CSW asked parents what questions they have, explaining that CSW cannot provide a medical update on their son, but can ensure that they get their questions addressed.  MOB replied, "I don't know why my baby is in the NICU."  Parents report that they have spoken with a doctor and  state that, "the baby had fluid on his lungs and it needed to be sucked out."  CSW explained that full term is 40 weeks and therefore, their baby was born 71 weeks early.  CSW explained that baby is "critical," but that NICU staff is doing everything to provide the care he needs.  Parents report that they are "happy to have a baby, but baby is sick so we are so upset."  MOB became tearful, which finally gave CSW indication that they have some level of understanding of the severity of the situation.  CSW explained the ability to speak with medical staff at any time by contacting CSW to arrange.  FOB's only question was "can we get an update on baby's progress everyday?"  CSW explained that any time they are  here, they can request an update and that every hospital staff person is capable of obtaining an interpreter, either by phone or in person. CSW was concerned that informing parents of SSI eligibility could be confusing and stressful, but felt they needed to know that applying for this financial benefit is an option.  CSW explained baby's eligibility and how to apply if they are interested.  MOB states that she just wants to focus on her baby and see how he does.  CSW stated complete understanding and asked them to notify CSW if they have questions about this at any time. CSW briefly spoke about common emotions often related to this experience and encouraged parents to help each other monitor their emotions.  CSW asked that they call CSW if they feel they are not able to cope with the stress and emotion of baby's hospitalization.   CSW asked parents to call CSW if they would like to schedule an in-person interpreter at any time, if they have questions they don't know who to ask, or if they would like to talk about their feelings.  Parents smiled and thanked CSW.    CSW Plan/Description:  Information/Referral to [employer] , [employer] , Psychosocial Support and Ongoing Assessment of Needs

## 2017-02-05 NOTE — Lactation Note (Signed)
This note was copied from a baby's chart. Lactation Consultation Note  Patient Name: Tiffany Crawford Today's Date: 02/05/2017  Interpreter used for visit.  Mom states she has been pumping but not obtaining milk yet.  Reassured and encouraged to continue pumping every 3 hours.   Maternal Data    Feeding    LATCH Score/Interventions                      Lactation Tools Discussed/Used     Consult Status      Huston FoleyMOULDEN, Sheryn Aldaz S 02/05/2017, 10:26 AM

## 2017-02-06 DIAGNOSIS — O3430 Maternal care for cervical incompetence, unspecified trimester: Secondary | ICD-10-CM

## 2017-02-06 DIAGNOSIS — Z789 Other specified health status: Secondary | ICD-10-CM | POA: Diagnosis not present

## 2017-02-06 LAB — GLUCOSE, CAPILLARY: Glucose-Capillary: 89 mg/dL (ref 65–99)

## 2017-02-06 MED ORDER — ACETAMINOPHEN 325 MG PO TABS: 650.0000 mg | ORAL_TABLET | ORAL | 0 refills | Status: AC | PRN

## 2017-02-06 MED ORDER — IBUPROFEN 600 MG PO TABS: 600.0000 mg | ORAL_TABLET | Freq: Four times a day (QID) | ORAL | 0 refills | Status: AC | PRN

## 2017-02-06 NOTE — Lactation Note (Signed)
This note was copied from a baby's chart. Lactation Consultation Note  Patient Name: Tiffany Crawford Today's Date: 02/06/2017  Spoke to parents via video interpreter.  Parents do not want to leave hospital to pick up pump today because baby is so critical.  If baby is more stable tomorrow I will schedule appointment for pump pick up.  Mom will use pump in her room and in NICU today.  She is pumping every 2-3 hours and no milk obtained yet.  Reassured mom this is normal.  Stressed importance of breast milk for baby.  Mom seems to understand.  Will continue to follow.   Maternal Data    Feeding    LATCH Score/Interventions                      Lactation Tools Discussed/Used     Consult Status      Huston FoleyMOULDEN, Phiona Ramnauth S 02/06/2017, 11:26 AM

## 2017-02-06 NOTE — Discharge Instructions (Signed)

## 2017-02-06 NOTE — Progress Notes (Signed)
Reviewed discharge instructions with patient [facility] [phone]. Pt and her husband were in Room 210 after NICU staff invited them to wait there when they became emotional in the waiting room on 2nd floor.  Their baby was having procedure done at the time they were upset.  Pt verbalized understanding of her discharge instructions including what symptoms she should report to her care provider, when her follow-up appointments are, and how she should take prescribed medications.  Patient had questions about the condition of her baby and what the plan was for her to have a breast pump.  Pt also under the impression that she will be spending the night in room 210.  Spoke to baby's nurse Dionisio DavidLaura Anthony and shared the patient's questions.  Vernona RiegerLaura said she would have the NP update the parents on baby's condition, as well as have the  NICU Charge nurse talk to parents  about about when they would need to leave room 210.  Rock NephewLaura Moulden, IBCLC also at NICU desk and aware of patient's need for breast pump and that patient's have questions.

## 2017-02-06 NOTE — Discharge Summary (Signed)
OB Discharge Summary     Patient Name: Tiffany Crawford DOB: 11/29/1980 MRN: [id number]  Date of admission: 02/04/2017 Delivering MD: Tiffany Crawford   Date of discharge: 02/06/2017  Admitting diagnosis: 19 WEEKS EXTREME PAIN CTX Intrauterine pregnancy: 6270w5d     Secondary diagnosis:  Active Problems:   Preterm premature rupture of membranes (PPROM) with unknown onset of labor   Preterm premature rupture of membranes (PPROM) delivered, current hospitalization   Language barrier   Cervical insufficiency during pregnancy, antepartum  Additional problems: None     Discharge diagnosis: Preterm Pregnancy Delivered                                                                                                Post partum procedures:N/A  Augmentation: N/A  Complications: None  Hospital course:  Onset of Labor With Vaginal Delivery     36 y.o. yo G1P0101 at 5470w5d was admitted in Active Labor on 02/04/2017. Patient had an uncomplicated labor course as follows:  Membrane Rupture Time/Date:   ,    Intrapartum Procedures: Episiotomy: None [1]                                         Lacerations:  None [1]  Patient had a delivery of a Viable infant. 02/04/2017  Information for the patient's newborn:  Tiffany Crawford [[id number]][[id number]]       Pateint had an uncomplicated postpartum course.  She is ambulating, tolerating a regular diet, passing flatus, and urinating well. Patient is discharged home in stable condition on 02/06/17.   Physical exam  Vitals:   02/05/17 2007 02/06/17 0012 02/06/17 0805 02/06/17 1222  BP: (!) 110/94 103/68 (!) 129/112 108/74  Pulse: 68 64 69 72  Resp: 16 16 16 16   Temp: 98.3 F (36.8 C) 97.4 F (36.3 C) 98.2 F (36.8 C) 98.8 F (37.1 C)  TempSrc: Oral Oral Oral Oral  SpO2: 100% 99% 100% 100%   General: alert, cooperative and no distress Lochia: appropriate Uterine Fundus: firm Incision: N/A DVT Evaluation: No evidence of DVT seen on physical exam. Labs: Lab Results   Component Value Date   WBC 25.0 (H) 02/04/2017   HGB 10.6 (L) 02/04/2017   HCT 31.6 (L) 02/04/2017   MCV 84.9 02/04/2017   PLT 253 02/04/2017   CMP Latest Ref Rng & Units 12/16/2016  Glucose 65 - 99 mg/dL 782(N128(H)  BUN 6 - 20 mg/dL 5(L)  Creatinine 5.620.57 - 1.00 mg/dL 1.30(Q0.43(L)  Sodium 657134 - 846144 mmol/L 136  Potassium 3.5 - 5.2 mmol/L 3.7  Chloride 96 - 106 mmol/L 99  CO2 18 - 29 mmol/L 23  Calcium 8.7 - 10.2 mg/dL 8.9  Total Protein 6.0 - 8.5 Crawford/dL 7.2  Total Bilirubin 0.0 - 1.2 mg/dL <9.6<0.2  Alkaline Phos 39 - 117 IU/L 56  AST 0 - 40 IU/L 23  ALT 0 - 32 IU/L 22    Discharge instruction: per After Visit Summary and "Baby and Me Booklet".  After visit meds:  Allergies as of 02/06/2017   No Known Allergies     Medication List    STOP taking these medications   ACCU-CHEK FASTCLIX LANCETS Misc   glucose blood test strip Commonly known as:  ACCU-CHEK GUIDE   glyBURIDE 2.5 MG tablet Commonly known as:  DIABETA   progesterone 200 MG capsule Commonly known as:  PROMETRIUM   pyridOXINE 100 MG tablet Commonly known as:  VITAMIN B-6   terconazole 0.4 % vaginal cream Commonly known as:  TERAZOL 7     TAKE these medications   acetaminophen 325 MG tablet Commonly known as:  TYLENOL Take 2 tablets (650 mg total) by mouth every 4 (four) hours as needed (for pain scale < 4).   ibuprofen 600 MG tablet Commonly known as:  ADVIL,MOTRIN Take 1 tablet (600 mg total) by mouth every 6 (six) hours as needed.   PREPLUS 27-1 MG Tabs Take 1 tablet by mouth daily.       Diet: routine diet  Activity: Advance as tolerated. Pelvic rest for 6 weeks.   Outpatient follow up:6 weeks Follow up Appt: Future Appointments Date Time Provider Department Center  02/17/2017 1:40 PM Tiffany Crawford [facility]  03/11/2017 3:20 PM Tiffany Sinning, Dimas Alexandria, PA-C [facility]   Follow up Visit:No Follow-up on file.  Postpartum contraception: unclear  Newborn Data: Live born female  Birth Weight:  1 lb 8 oz (680 Crawford) APGAR: 4, 3  Baby Feeding: NICU Disposition:NICU   02/06/2017 Tiffany Neighbours, MD

## 2017-02-06 NOTE — Progress Notes (Signed)
OB Note Patient in NICU. Front desk asked to page me when she comes back up. Will get CBG today. Possible d/c home today. Request sent to office for 10d PP visit.   Cornelia Copaharlie Nayelie Gionfriddo, Jr MD Attending Center [facility] 02/06/2017 Time: [phone]

## 2017-02-10 ENCOUNTER — Encounter (HOSPITAL_COMMUNITY): Payer: Self-pay

## 2017-02-10 ENCOUNTER — Ambulatory Visit (HOSPITAL_COMMUNITY): Payer: Medicaid Other

## 2017-02-12 ENCOUNTER — Encounter: Payer: Medicaid Other | Admitting: Obstetrics & Gynecology

## 2017-02-17 ENCOUNTER — Ambulatory Visit (INDEPENDENT_AMBULATORY_CARE_PROVIDER_SITE_OTHER): Payer: Medicaid Other | Admitting: Medical

## 2017-02-17 ENCOUNTER — Encounter: Payer: Self-pay | Admitting: Medical

## 2017-02-17 DIAGNOSIS — N883 Incompetence of cervix uteri: Secondary | ICD-10-CM

## 2017-02-17 NOTE — Patient Instructions (Addendum)
Postpartum Depression and Baby Blues The postpartum period begins right after the birth of a baby. During this time, there is often a great amount of joy and excitement. It is also a time of many changes in the life of the parents. Regardless of how many times a mother gives birth, each child brings new challenges and dynamics to the family. It is not unusual to have feelings of excitement along with confusing shifts in moods, emotions, and thoughts. All mothers are at risk of developing postpartum depression or the "baby blues." These mood changes can occur right after giving birth, or they may occur many months after giving birth. The baby blues or postpartum depression can be mild or severe. Additionally, postpartum depression can go away rather quickly, or it can be a long-term condition. What are the causes? Raised hormone levels and the rapid drop in those levels are thought to be a main cause of postpartum depression and the baby blues. A number of hormones change during and after pregnancy. Estrogen and progesterone usually decrease right after the delivery of your baby. The levels of thyroid hormone and various cortisol steroids also rapidly drop. Other factors that play a role in these mood changes include major life events and genetics. What increases the risk? If you have any of the following risks for the baby blues or postpartum depression, know what symptoms to watch out for during the postpartum period. Risk factors that may increase the likelihood of getting the baby blues or postpartum depression include:  Having a personal or family history of depression.  Having depression while being pregnant.  Having premenstrual mood issues or mood issues related to oral contraceptives.  Having a lot of life stress.  Having marital conflict.  Lacking a social support network.  Having a baby with special needs.  Having health problems, such as diabetes.  What are the signs or  symptoms? Symptoms of baby blues include:  Brief changes in mood, such as going from extreme happiness to sadness.  Decreased concentration.  Difficulty sleeping.  Crying spells, tearfulness.  Irritability.  Anxiety.  Symptoms of postpartum depression typically begin within the first month after giving birth. These symptoms include:  Difficulty sleeping or excessive sleepiness.  Marked weight loss.  Agitation.  Feelings of worthlessness.  Lack of interest in activity or food.  Postpartum psychosis is a very serious condition and can be dangerous. Fortunately, it is rare. Displaying any of the following symptoms is cause for immediate medical attention. Symptoms of postpartum psychosis include:  Hallucinations and delusions.  Bizarre or disorganized behavior.  Confusion or disorientation.  How is this diagnosed? A diagnosis is made by an evaluation of your symptoms. There are no medical or lab tests that lead to a diagnosis, but there are various questionnaires that a health care provider may use to identify those with the baby blues, postpartum depression, or psychosis. Often, a screening tool called the Edinburgh Postnatal Depression Scale is used to diagnose depression in the postpartum period. How is this treated? The baby blues usually goes away on its own in 1-2 weeks. Social support is often all that is needed. You will be encouraged to get adequate sleep and rest. Occasionally, you may be given medicines to help you sleep. Postpartum depression requires treatment because it can last several months or longer if it is not treated. Treatment may include individual or group therapy, medicine, or both to address any social, physiological, and psychological factors that may play a role in the   depression. Regular exercise, a healthy diet, rest, and social support may also be strongly recommended. Postpartum psychosis is more serious and needs treatment right away.  Hospitalization is often needed. Follow these instructions at home:  Get as much rest as you can. Nap when the baby sleeps.  Exercise regularly. Some women find yoga and walking to be beneficial.  Eat a balanced and nourishing diet.  Do little things that you enjoy. Have a cup of tea, take a bubble bath, read your favorite magazine, or listen to your favorite music.  Avoid alcohol.  Ask for help with household chores, cooking, grocery shopping, or running errands as needed. Do not try to do everything.  Talk to people close to you about how you are feeling. Get support from your partner, family members, friends, or other new moms.  Try to stay positive in how you think. Think about the things you are grateful for.  Do not spend a lot of time alone.  Only take over-the-counter or prescription medicine as directed by your health care provider.  Keep all your postpartum appointments.  Let your health care provider know if you have any concerns. Contact a health care provider if: You are having a reaction to or problems with your medicine. Get help right away if:  You have suicidal feelings.  You think you may harm the baby or someone else. This information is not intended to replace advice given to you by your health care provider. Make sure you discuss any questions you have with your health care provider. Document Released: 06/20/2004 Document Revised: 02/22/2016 Document Reviewed: 06/28/2013 Elsevier Interactive Patient Education  2017 Elsevier Inc.  

## 2017-02-17 NOTE — Progress Notes (Signed)
[place] interpreter # [phone] Subjective:     Maliyah Ka Desilets is a 36 y.o. female who presents for a postpartum visit. She is 2 weeks postpartum following a spontaneous vaginal delivery. I have fully reviewed the prenatal and intrapartum course. The delivery was at 23 gestational weeks. Outcome: spontaneous vaginal delivery. Anesthesia: none. Postpartum course has been unremarkable so far. Baby passed after 5 days of life due to extreme prematurity. Baby is feeding by n/a. Bleeding staining only. Bowel function is normal. Bladder function is normal. Patient is not sexually active. Contraception method is none. Postpartum depression screening: unable to be completed due to language barrier.  The following portions of the patient's history were reviewed and updated as appropriate: allergies, current medications, past family history, past medical history, past social history, past surgical history and problem list.  Review of Systems Pertinent items are noted in HPI.   Objective:    BP 111/75   Pulse 86   General:  alert, appears stated age and upset   Breasts:  not performed  Lungs: normal effort  Heart:  Normal rate  Abdomen: soft   Vulva:  not evaluated  Vagina: not evaluated  Cervix:  not evaluated  Corpus: not examined  Adnexa:  not evaluated  Rectal Exam: Not performed.        Assessment:     Normal postpartum exam. Pap smear not done at today's visit.  Patient is unable to complete PHQ-9 at this time. [facility] States that she has family support at home. Denies SI or HI today.  Plan:    1. Contraception: abstinence  2. [facility] any time if needed 3. Follow up in: 3 weeks as scheduled for follow-up or sooner as needed.    Marny LowensteinWenzel, Marda Breidenbach N, PA-C 02/17/2017 2:27 PM

## 2017-02-17 NOTE — Progress Notes (Signed)
Patient declines to see Asher MuirJamie today, stated she did not want to talk about it.

## 2017-03-11 ENCOUNTER — Ambulatory Visit (INDEPENDENT_AMBULATORY_CARE_PROVIDER_SITE_OTHER): Payer: Medicaid Other | Admitting: Medical

## 2017-03-11 ENCOUNTER — Encounter: Payer: Self-pay | Admitting: Medical

## 2017-03-11 DIAGNOSIS — B373 Candidiasis of vulva and vagina: Secondary | ICD-10-CM

## 2017-03-11 DIAGNOSIS — B3731 Acute candidiasis of vulva and vagina: Secondary | ICD-10-CM

## 2017-03-11 MED ORDER — FLUCONAZOLE 150 MG PO TABS: 150.0000 mg | ORAL_TABLET | Freq: Once | ORAL | 0 refills | Status: AC

## 2017-03-11 MED ORDER — NORGESTIMATE-ETH ESTRADIOL 0.25-35 MG-MCG PO TABS: 1.0000 | ORAL_TABLET | Freq: Every day | ORAL | 11 refills | Status: AC

## 2017-03-11 NOTE — Progress Notes (Signed)
Subjective:     Tiffany Crawford is a 36 y.o. female who presents for a postpartum visit. She is 6 weeks postpartum following a spontaneous vaginal delivery. I have fully reviewed the prenatal and intrapartum course. The delivery was at 23 gestational weeks. Outcome: spontaneous vaginal delivery. Anesthesia: none. Postpartum course has been normal. Baby passed away after 5 days of life due to extreme prematurity. Bleeding no bleeding. Bowel function is normal. Bladder function is normal. Patient is not sexually active. Contraception method is none. Postpartum depression screening: not available in her language.  Leg pain at night, only at night, every night. She describes this as an aching pain with occasional tingling. She has tried Tylenol at times and this does help, but she doesn't want to take this every night. This has been present x 1-2 months.   The following portions of the patient's history were reviewed and updated as appropriate: allergies, current medications, past family history, past medical history, past social history, past surgical history and problem list.  Review of Systems Pertinent items are noted in HPI.   Objective:    BP 111/82   Pulse 79   Wt 161 lb 6.4 oz (73.2 kg)   BMI 29.52 kg/m   General:  alert and cooperative   Breasts:  not performed  Lungs: clear to auscultation bilaterally  Heart:  regular rate and rhythm, S1, S2 normal, no murmur, click, rub or gallop  Abdomen: soft, non-tender; bowel sounds normal; no masses,  no organomegaly   Vulva:  normal  Vagina: small amount of thick, white, clumpy discharge  Cervix:  not evaluated  Corpus: normal size, contour, position, consistency, mobility, non-tender  Adnexa:  normal adnexa and no mass, fullness, tenderness  Rectal Exam: Not performed.       Lower extremities: no edema, normal reflexes, normal distal pulses, normal strength and ROM Assessment:     Normal postpartum exam. Pap smear not done at today's visit.   H/O Neonatal demise at 5 days  Yeast vulvovaginitis  Leg pain Birth control counseling  - patient is interested in some short term birth control until she is prepared to conceive again. Patient states that she would like to try OCPs Plan:    1. Contraception: none 2. Rx for Diflucan and Sprintec sent to patient's pharmacy  3. Patient advised to increased Miguel hydration and ensure appropriate K+ in diet, if symptoms do not improve, she will need to see a PCP 4. Follow up in: as needed for routine GYN care    Marny LowensteinWenzel, Tiffany Dube N, PA-C 03/11/2017 3:29 PM

## 2017-03-11 NOTE — Patient Instructions (Signed)

## 2018-06-01 IMAGING — US US MFM OB FOLLOW-UP
1 series · 14 of 28 positions shown · non-contrast
Comparison: none

[Series 1: us mfm ob follow-up · 60 acquisitions, 14 frames shown]
[im 3/60]
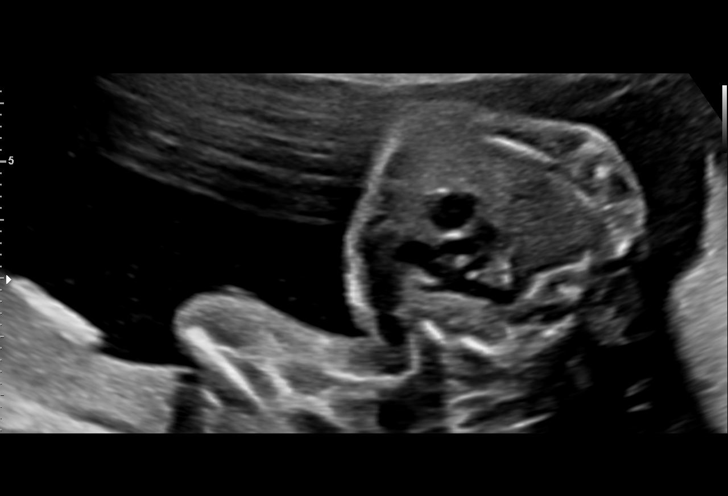
[im 7/60]
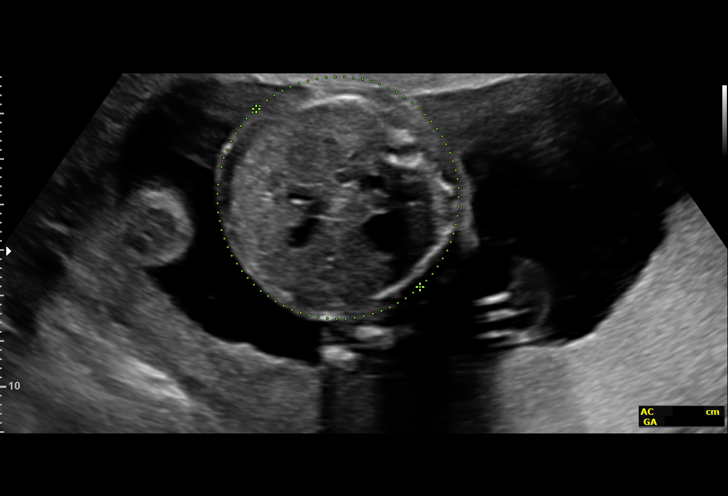
[im 11/60]
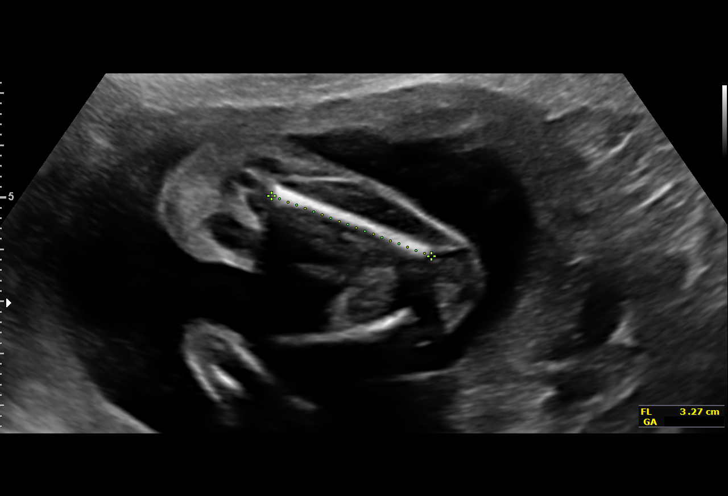
[im 16/60]
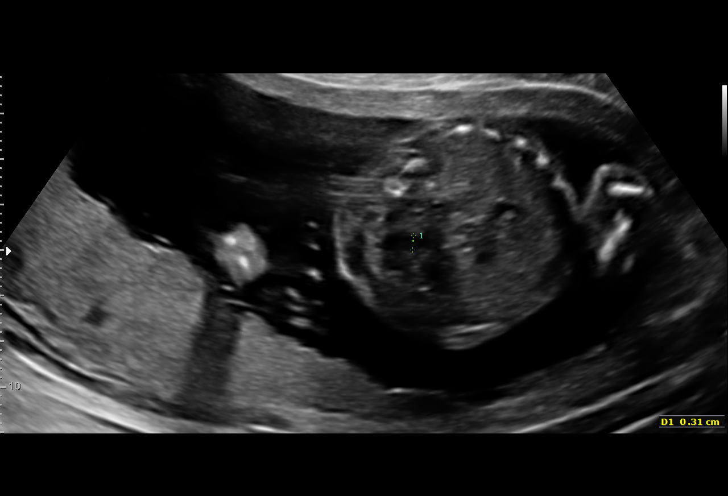
[im 20/60]
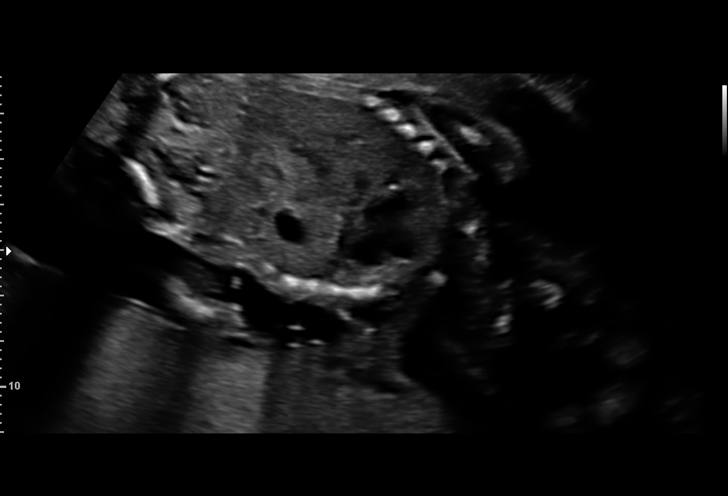
[im 25/60]
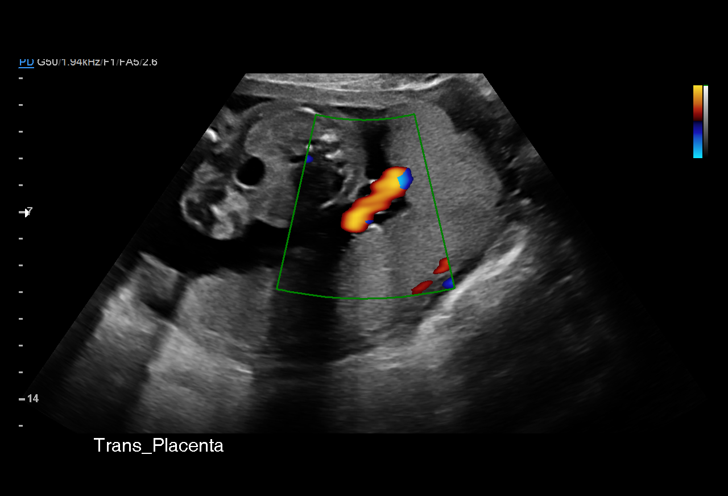
[im 29/60]
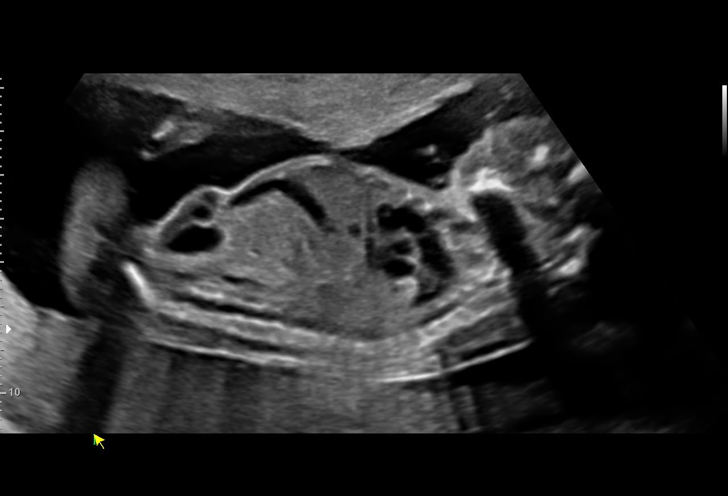
[im 33/60]
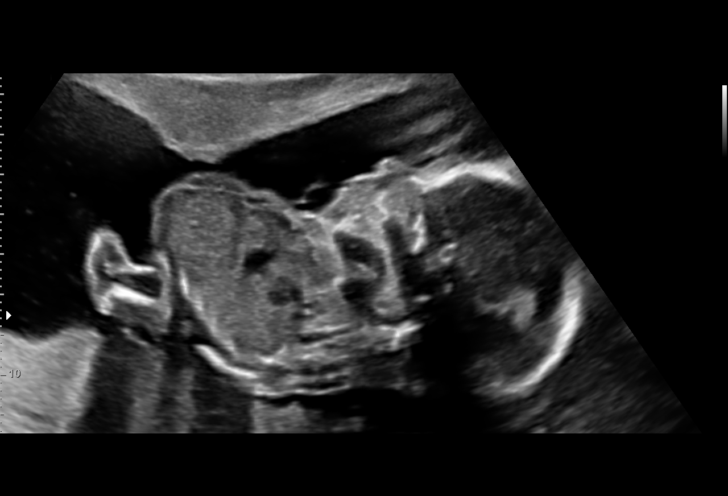
[im 38/60]
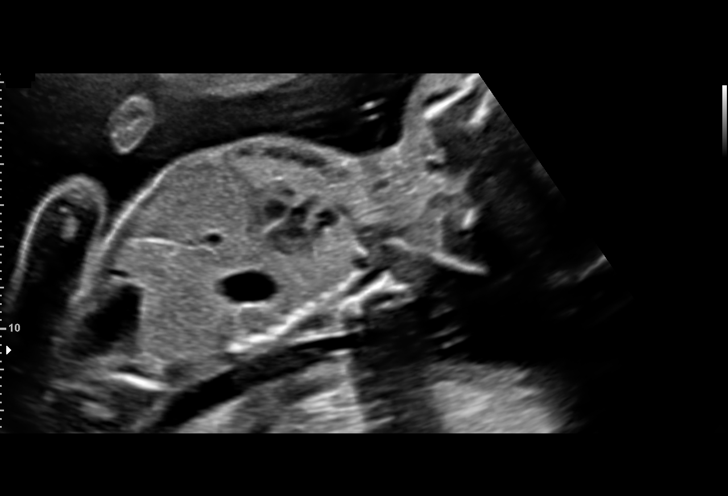
[im 42/60]
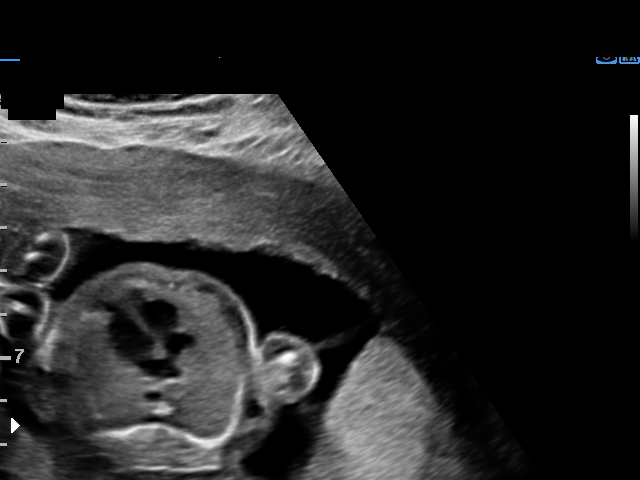
[im 46/60]
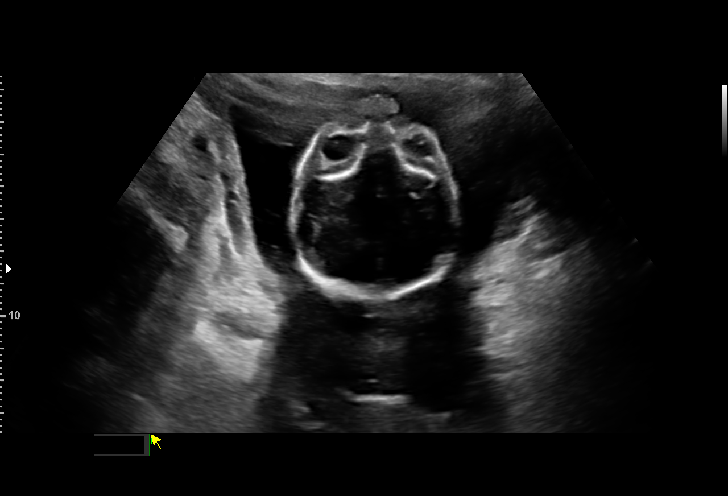
[im 51/60]
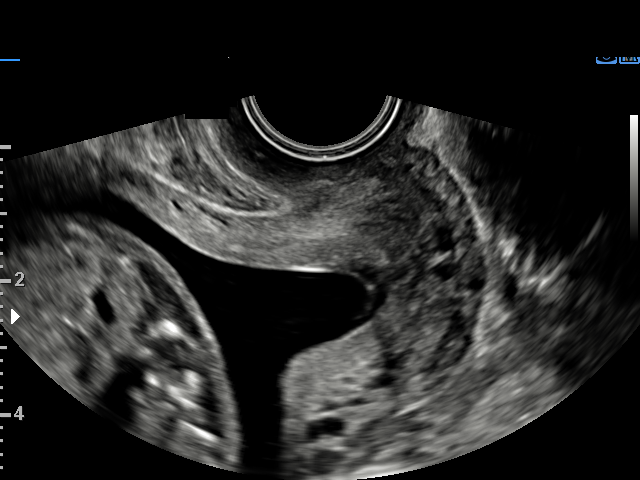
[im 55/60]
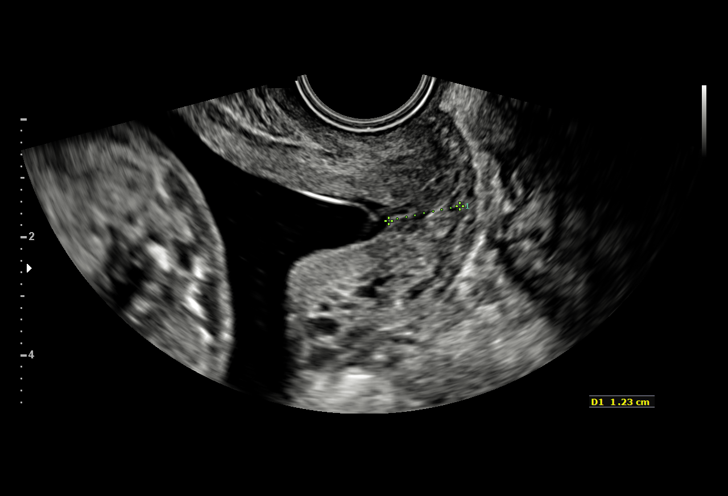
[im 60/60]
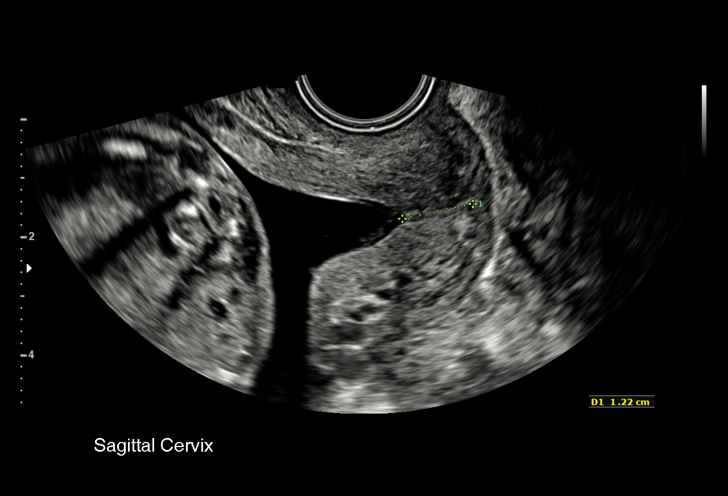

[14 of 28 positions shown; findings below may reference images not displayed]

OB/Gyn Clinic
[REDACTED]

1  ANNMARIE RAMKUMAR            [id number]      [id number]     [id number]
2  ANNMARIE RAMKUMAR            [id number]      [id number]     [id number]
Indications

20 weeks gestation of pregnancy
Antenatal follow-up for nonvisualized fetal
anatomy
Gestational diabetes in pregnancy,
controlled by oral hypoglycemic drugs
Cervical shortening, second trimester
OB History

Gravidity:    1         Term:   0        Prem:   0        SAB:   0
TOP:          0       Ectopic:  0        Living: 0
Fetal Evaluation

Num Of Fetuses:     1
Cardiac Activity:   Observed
Presentation:       Cephalic
Placenta:           Posterior, above cervical os
P. Cord Insertion:  Visualized
Amniotic Fluid
AFI FV:      Subjectively within normal limits
Biometry

BPD:      51.8  mm     G. Age:  21w 5d         81  %    CI:        79.36   %    70 - 86
FL/HC:       18.3  %    15.9 -
HC:      183.8  mm     G. Age:  20w 5d         36  %    HC/AC:       1.09       1.06 -
AC:      168.3  mm     G. Age:  21w 6d         75  %    FL/BPD:      64.9  %
FL:       33.6  mm     G. Age:  20w 4d         30  %    FL/AC:       20.0  %    20 - 24

Est. FW:     408   gm    0 lb 14 oz     52  %
Gestational Age

LMP:           25w 0d        Date:  07/24/16                 EDD:   04/30/17
U/S Today:     21w 2d                                        EDD:   05/26/17
Best:          20w 6d     Det. By:  U/S  (12/18/16)          EDD:   05/29/17
Anatomy

Cranium:               Appears normal         Aortic Arch:            Appears normal
Cavum:                 Previously seen        Ductal Arch:            Appears normal
Ventricles:            Appears normal         Diaphragm:              Appears normal
Choroid Plexus:        Appears normal         Stomach:                Appears normal, left
sided
Cerebellum:            Previously seen        Abdomen:                Appears normal
Posterior Fossa:       Previously seen        Abdominal Wall:         Previously seen
Nuchal Fold:           Previously seen        Cord Vessels:           Previously seen
Face:                  Appears normal         Kidneys:                Appear normal
(orbits and profile)
Lips:                  Appears normal         Bladder:                Appears normal
Thoracic:              Appears normal         Spine:                  Previously seen
Heart:                 Appears normal         Upper Extremities:      Previously seen
(4CH, axis, and situs
RVOT:                  Appears normal         Lower Extremities:      Previously seen
LVOT:                  Appears normal

Other:  Heels previously visualized. Right 5th digit previously seen.
Technically difficult due to early gestational age.
Cervix Uterus Adnexa

Cervix
Length:            1.2  cm.
Appears funnelled, see comments. Measured transvaginally.
Impression

SIUP at 20+6 weeks
Normal detailed fetal anatomy
Normal amniotic fluid volume
Measurements consistent with a prior US
EV views of cervix: funneling with distal closed portion
measuring 1.2 cms

The US findings were shared with Ms. Zhao Sheny.  The
implications of a funneled and shortened cervix were
discussed in detail.
Recommendations

Dieter Giordano nightly
F/U CL on [REDACTED]

## 2018-06-20 IMAGING — US US MFM OB TRANSVAGINAL
1 series · 9 of 9 positions shown · non-contrast
Comparison: none

[Series 1: us mfm ob transvaginal · 9 acquisitions, 9 frames shown]
[im 1/9]
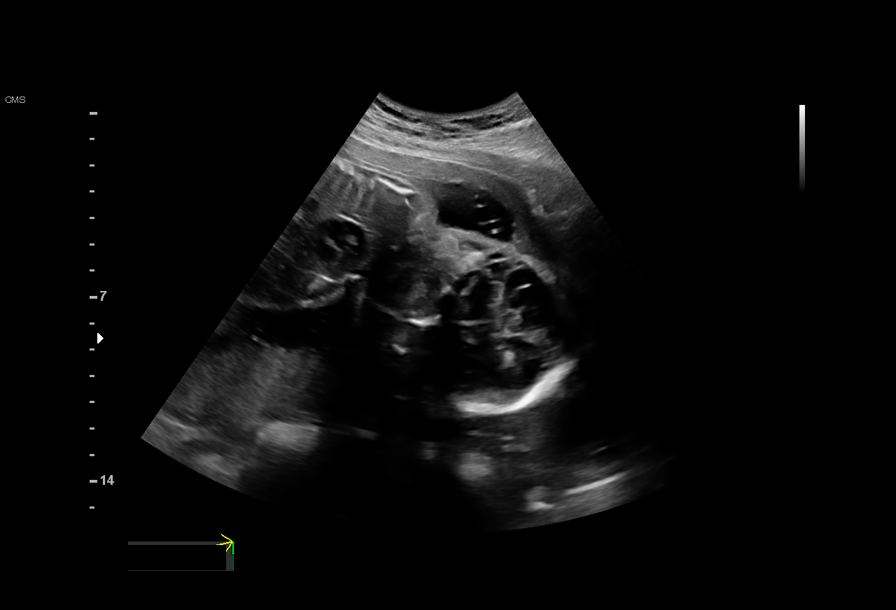
[im 2/9]
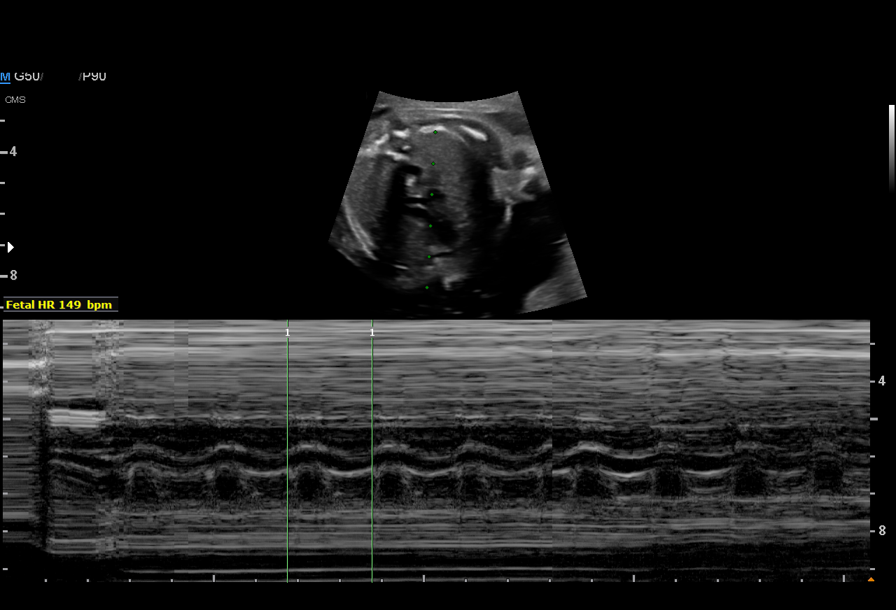
[im 3/9]
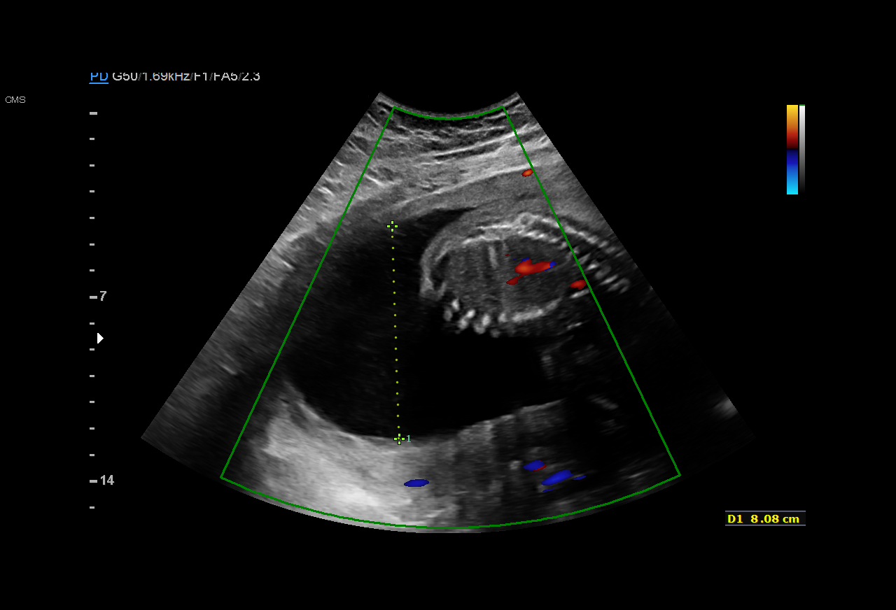
[im 4/9]
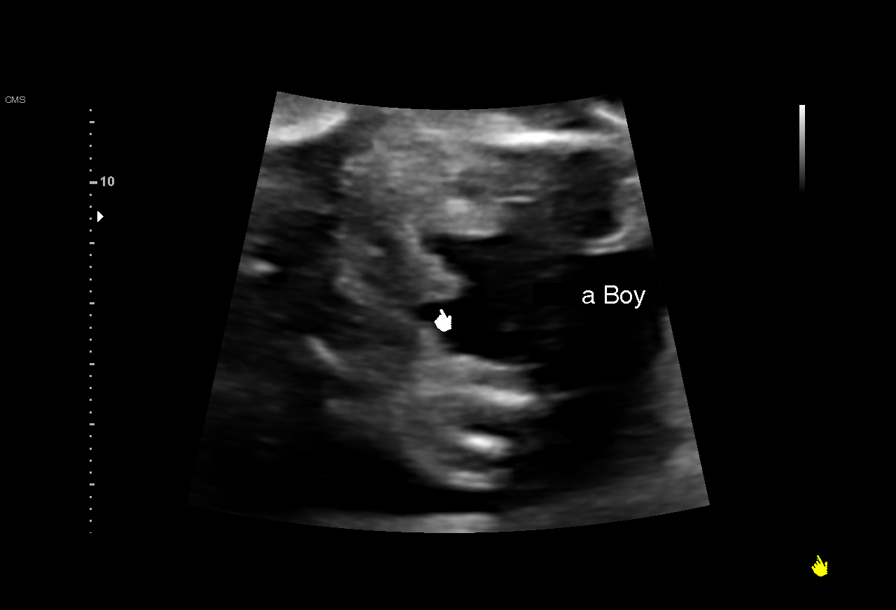
[im 5/9]
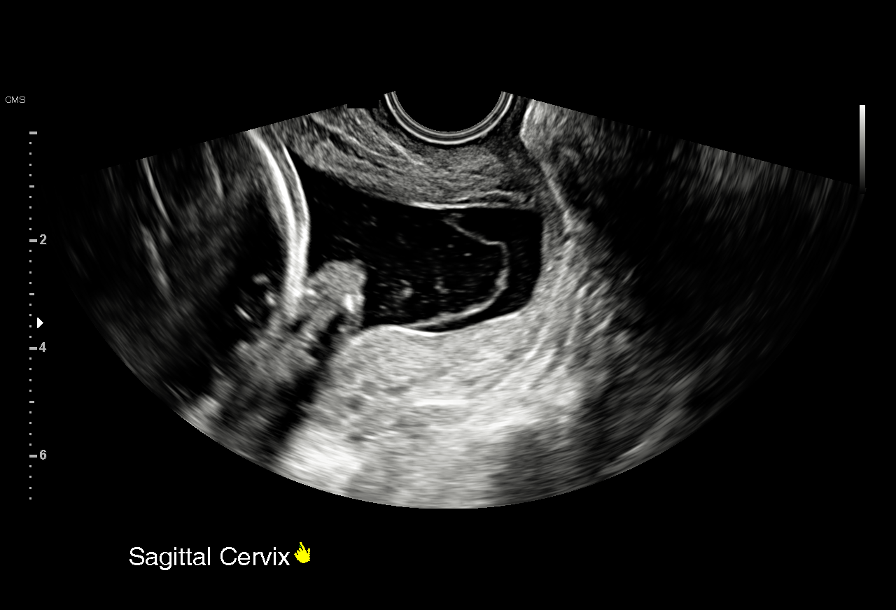
[im 6/9]
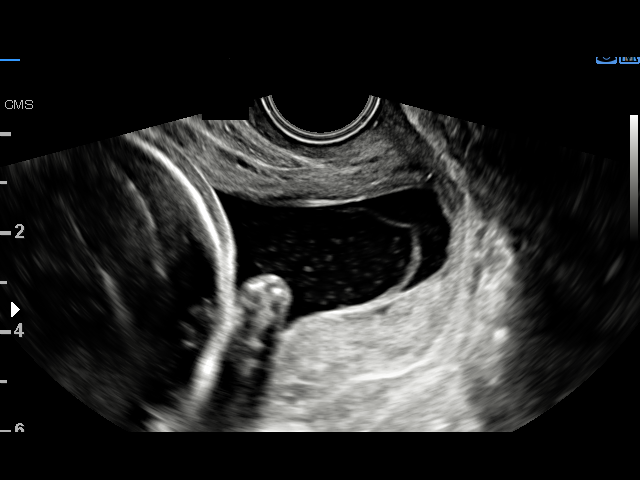
[im 7/9]
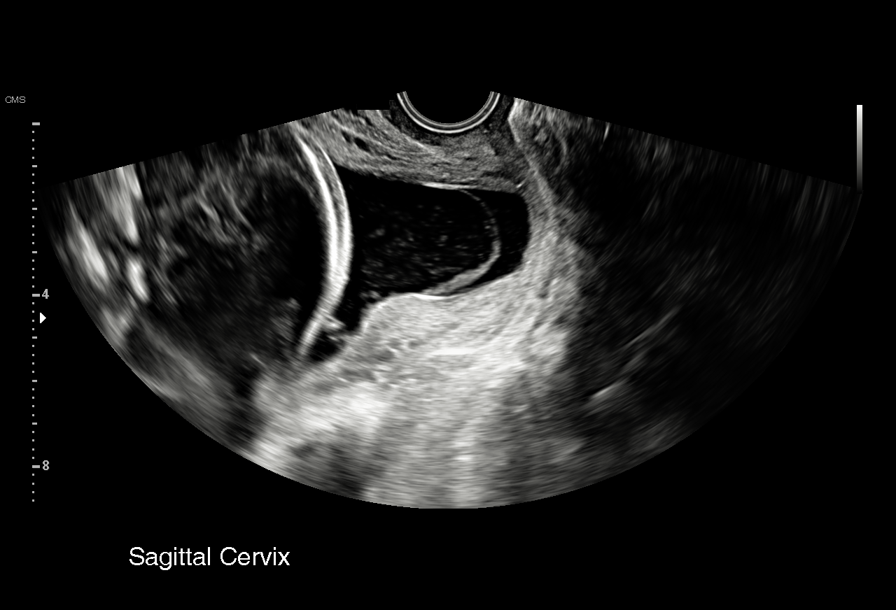
[im 8/9]
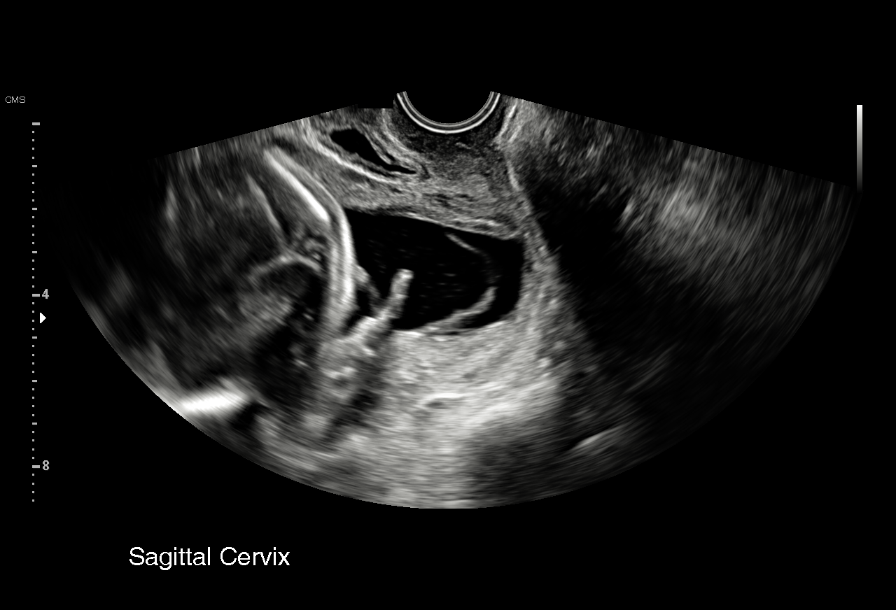
[im 9/9]
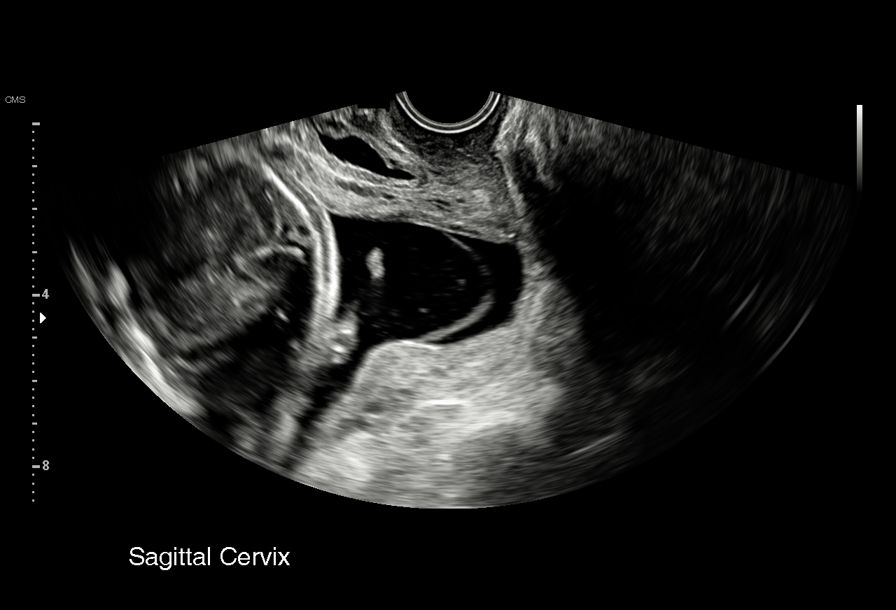

[9 of 9 positions shown; findings below may reference images not displayed]

OB/Gyn Clinic
[REDACTED]

1  EGIAN BUKMISHI            [id number]      [id number]     [id number]
Indications

23 weeks gestation of pregnancy
Gestational diabetes in pregnancy,
controlled by oral hypoglycemic drugs
(glyburide)
Cervical shortening, second trimester
(Tripti)
OB History

Gravidity:    1         Term:   0        Prem:   0        SAB:   0
TOP:          0       Ectopic:  0        Living: 0
Fetal Evaluation

Num Of Fetuses:     1
Fetal Heart         149
Rate(bpm):
Cardiac Activity:   Observed
Presentation:       Cephalic

Amniotic Fluid
AFI FV:      Subjectively within normal limits
Largest Pocket(cm)
8.0
Gestational Age

LMP:           27w 5d       Date:   07/24/16                 EDD:   04/30/17
Best:          23w 4d    Det. By:   U/S  (12/18/16)          EDD:   05/29/17
1st Trimester Genetic Sonogram Screening

Nasal Bone:                 Present
Impression

Single IUP at 23w 4d
Follow up due to shortened cervix - previously declined
cerclage.  Currently on vaginal progesterone

TVUS - no measureable cervix.  V-shaped funneling that
extends almost the full length of the cervix

Ultrasound findings were discussed with the patient through
with an interpreter.  The patient is now outside the gestational
window for cerclage. Discussed use of cervical pessary -
initial studies were favorable, however subsequent studies
have did not show benefit in shortened cervix.  Nevertheless,
feel that there is little risk to offering this intervention.  After
counseling, the patient elected to undergo a trial of cervical
pessary.  Pessary was placed easily without complications.
Recommendations

Preterm labor precautions
Course of R Nair - first dose given following clinic
visit
Recommend follow up ultrasound for cervical length next
week
